# Patient Record
Sex: Male | Born: 1945 | Race: White | Hispanic: No | Marital: Married | Smoking: Former smoker
Health system: Southern US, Community
[De-identification: ages and names within clinical notes are randomized; demographics above are authoritative.]

## PROBLEM LIST (undated history)

## (undated) DIAGNOSIS — M109 Gout, unspecified: Secondary | ICD-10-CM

## (undated) DIAGNOSIS — E559 Vitamin D deficiency, unspecified: Secondary | ICD-10-CM

## (undated) DIAGNOSIS — E785 Hyperlipidemia, unspecified: Secondary | ICD-10-CM

## (undated) DIAGNOSIS — K219 Gastro-esophageal reflux disease without esophagitis: Secondary | ICD-10-CM

## (undated) DIAGNOSIS — G839 Paralytic syndrome, unspecified: Secondary | ICD-10-CM

## (undated) DIAGNOSIS — R748 Abnormal levels of other serum enzymes: Secondary | ICD-10-CM

## (undated) DIAGNOSIS — J329 Chronic sinusitis, unspecified: Secondary | ICD-10-CM

## (undated) DIAGNOSIS — I10 Essential (primary) hypertension: Secondary | ICD-10-CM

## (undated) DIAGNOSIS — D72829 Elevated white blood cell count, unspecified: Secondary | ICD-10-CM

## (undated) DIAGNOSIS — Z832 Family history of diseases of the blood and blood-forming organs and certain disorders involving the immune mechanism: Secondary | ICD-10-CM

## (undated) DIAGNOSIS — K5790 Diverticulosis of intestine, part unspecified, without perforation or abscess without bleeding: Secondary | ICD-10-CM

## (undated) DIAGNOSIS — I7789 Other specified disorders of arteries and arterioles: Secondary | ICD-10-CM

## (undated) DIAGNOSIS — E039 Hypothyroidism, unspecified: Secondary | ICD-10-CM

## (undated) DIAGNOSIS — I77819 Aortic ectasia, unspecified site: Secondary | ICD-10-CM

## (undated) HISTORY — DX: Gastro-esophageal reflux disease without esophagitis: K21.9

## (undated) HISTORY — DX: Hyperlipidemia, unspecified: E78.5

## (undated) HISTORY — DX: Elevated white blood cell count, unspecified: D72.829

## (undated) HISTORY — DX: Paralytic syndrome, unspecified: G83.9

## (undated) HISTORY — DX: Gout, unspecified: M10.9

## (undated) HISTORY — DX: Essential (primary) hypertension: I10

## (undated) HISTORY — DX: Chronic sinusitis, unspecified: J32.9

## (undated) HISTORY — DX: Hypothyroidism, unspecified: E03.9

## (undated) HISTORY — DX: Family history of diseases of the blood and blood-forming organs and certain disorders involving the immune mechanism: Z83.2

## (undated) HISTORY — DX: Vitamin D deficiency, unspecified: E55.9

## (undated) HISTORY — DX: Other specified disorders of arteries and arterioles: I77.89

## (undated) HISTORY — DX: Abnormal levels of other serum enzymes: R74.8

## (undated) HISTORY — DX: Aortic ectasia, unspecified site: I77.819

## (undated) HISTORY — DX: Diverticulosis of intestine, part unspecified, without perforation or abscess without bleeding: K57.90

---

## 2001-05-01 ENCOUNTER — Ambulatory Visit (HOSPITAL_COMMUNITY): Admission: RE | Admit: 2001-05-01 | Discharge: 2001-05-01 | Payer: Self-pay | Admitting: Gastroenterology

## 2004-01-28 ENCOUNTER — Encounter: Admission: RE | Admit: 2004-01-28 | Discharge: 2004-01-28 | Payer: Self-pay | Admitting: Internal Medicine

## 2004-02-09 ENCOUNTER — Encounter: Admission: RE | Admit: 2004-02-09 | Discharge: 2004-02-09 | Payer: Self-pay | Admitting: Gastroenterology

## 2005-10-19 ENCOUNTER — Encounter: Admission: RE | Admit: 2005-10-19 | Discharge: 2005-10-19 | Payer: Self-pay | Admitting: Internal Medicine

## 2013-11-20 DIAGNOSIS — E782 Mixed hyperlipidemia: Secondary | ICD-10-CM | POA: Diagnosis not present

## 2013-11-20 DIAGNOSIS — Z23 Encounter for immunization: Secondary | ICD-10-CM | POA: Diagnosis not present

## 2013-11-20 DIAGNOSIS — I10 Essential (primary) hypertension: Secondary | ICD-10-CM | POA: Diagnosis not present

## 2013-11-20 DIAGNOSIS — Z0000 Encounter for general adult medical examination without abnormal findings: Secondary | ICD-10-CM | POA: Diagnosis not present

## 2013-11-20 DIAGNOSIS — E039 Hypothyroidism, unspecified: Secondary | ICD-10-CM | POA: Diagnosis not present

## 2013-11-20 DIAGNOSIS — Z79899 Other long term (current) drug therapy: Secondary | ICD-10-CM | POA: Diagnosis not present

## 2013-11-20 DIAGNOSIS — E559 Vitamin D deficiency, unspecified: Secondary | ICD-10-CM | POA: Diagnosis not present

## 2013-11-20 DIAGNOSIS — M109 Gout, unspecified: Secondary | ICD-10-CM | POA: Diagnosis not present

## 2013-11-20 DIAGNOSIS — Z125 Encounter for screening for malignant neoplasm of prostate: Secondary | ICD-10-CM | POA: Diagnosis not present

## 2014-04-01 DIAGNOSIS — W57XXXA Bitten or stung by nonvenomous insect and other nonvenomous arthropods, initial encounter: Secondary | ICD-10-CM | POA: Diagnosis not present

## 2014-04-01 DIAGNOSIS — S30860A Insect bite (nonvenomous) of lower back and pelvis, initial encounter: Secondary | ICD-10-CM | POA: Diagnosis not present

## 2014-09-04 DIAGNOSIS — E039 Hypothyroidism, unspecified: Secondary | ICD-10-CM | POA: Diagnosis not present

## 2014-09-04 DIAGNOSIS — M109 Gout, unspecified: Secondary | ICD-10-CM | POA: Diagnosis not present

## 2014-09-04 DIAGNOSIS — I10 Essential (primary) hypertension: Secondary | ICD-10-CM | POA: Diagnosis not present

## 2014-09-04 DIAGNOSIS — E782 Mixed hyperlipidemia: Secondary | ICD-10-CM | POA: Diagnosis not present

## 2014-11-10 DIAGNOSIS — Z0000 Encounter for general adult medical examination without abnormal findings: Secondary | ICD-10-CM | POA: Diagnosis not present

## 2014-11-10 DIAGNOSIS — E782 Mixed hyperlipidemia: Secondary | ICD-10-CM | POA: Diagnosis not present

## 2014-11-10 DIAGNOSIS — K5730 Diverticulosis of large intestine without perforation or abscess without bleeding: Secondary | ICD-10-CM | POA: Diagnosis not present

## 2014-11-10 DIAGNOSIS — Z79899 Other long term (current) drug therapy: Secondary | ICD-10-CM | POA: Diagnosis not present

## 2014-11-10 DIAGNOSIS — E559 Vitamin D deficiency, unspecified: Secondary | ICD-10-CM | POA: Diagnosis not present

## 2014-11-10 DIAGNOSIS — I10 Essential (primary) hypertension: Secondary | ICD-10-CM | POA: Diagnosis not present

## 2014-11-10 DIAGNOSIS — Z125 Encounter for screening for malignant neoplasm of prostate: Secondary | ICD-10-CM | POA: Diagnosis not present

## 2014-11-10 DIAGNOSIS — M109 Gout, unspecified: Secondary | ICD-10-CM | POA: Diagnosis not present

## 2014-11-10 DIAGNOSIS — E039 Hypothyroidism, unspecified: Secondary | ICD-10-CM | POA: Diagnosis not present

## 2014-11-20 ENCOUNTER — Encounter (INDEPENDENT_AMBULATORY_CARE_PROVIDER_SITE_OTHER): Payer: Self-pay

## 2014-11-20 ENCOUNTER — Other Ambulatory Visit: Payer: Self-pay | Admitting: Internal Medicine

## 2014-11-20 ENCOUNTER — Ambulatory Visit
Admission: RE | Admit: 2014-11-20 | Discharge: 2014-11-20 | Disposition: A | Discharge status: Home / Self Care | Payer: Medicare Other | Source: Ambulatory Visit | Attending: Internal Medicine | Admitting: Internal Medicine

## 2014-11-20 DIAGNOSIS — R51 Headache: Secondary | ICD-10-CM | POA: Diagnosis not present

## 2014-11-20 DIAGNOSIS — I10 Essential (primary) hypertension: Secondary | ICD-10-CM | POA: Diagnosis not present

## 2014-11-20 DIAGNOSIS — R519 Headache, unspecified: Secondary | ICD-10-CM

## 2014-11-20 DIAGNOSIS — J0100 Acute maxillary sinusitis, unspecified: Secondary | ICD-10-CM | POA: Diagnosis not present

## 2014-12-11 ENCOUNTER — Other Ambulatory Visit: Payer: Self-pay | Admitting: Internal Medicine

## 2014-12-11 DIAGNOSIS — R51 Headache: Principal | ICD-10-CM

## 2014-12-11 DIAGNOSIS — R519 Headache, unspecified: Secondary | ICD-10-CM

## 2014-12-12 ENCOUNTER — Ambulatory Visit
Admission: RE | Admit: 2014-12-12 | Discharge: 2014-12-12 | Disposition: A | Discharge status: Home / Self Care | Payer: Medicare Other | Source: Ambulatory Visit | Attending: Internal Medicine | Admitting: Internal Medicine

## 2014-12-12 DIAGNOSIS — R519 Headache, unspecified: Secondary | ICD-10-CM

## 2014-12-12 DIAGNOSIS — R51 Headache: Principal | ICD-10-CM

## 2015-08-07 DIAGNOSIS — L738 Other specified follicular disorders: Secondary | ICD-10-CM | POA: Diagnosis not present

## 2015-08-07 DIAGNOSIS — I788 Other diseases of capillaries: Secondary | ICD-10-CM | POA: Diagnosis not present

## 2015-08-07 DIAGNOSIS — L570 Actinic keratosis: Secondary | ICD-10-CM | POA: Diagnosis not present

## 2015-08-07 DIAGNOSIS — L820 Inflamed seborrheic keratosis: Secondary | ICD-10-CM | POA: Diagnosis not present

## 2015-08-07 DIAGNOSIS — L821 Other seborrheic keratosis: Secondary | ICD-10-CM | POA: Diagnosis not present

## 2015-09-26 IMAGING — CT CT HEAD W/O CM
2 series · 16 of 30 positions shown, 20 images · non-contrast
Comparison: None.

CLINICAL DATA: Headache x8 weeks, right temporal pain with
throbbing sensation

EXAM:
CT HEAD WITHOUT CONTRAST
TECHNIQUE: Contiguous axial images were obtained from the base of the skull
through the vertex without intravenous contrast.

[Series 2: head w/o · axial · non-contrast · 0.49mm/px · z∈[-1,+136]mm · 13 of 32 slices shown, 17 images]
[im 3/32  brain]
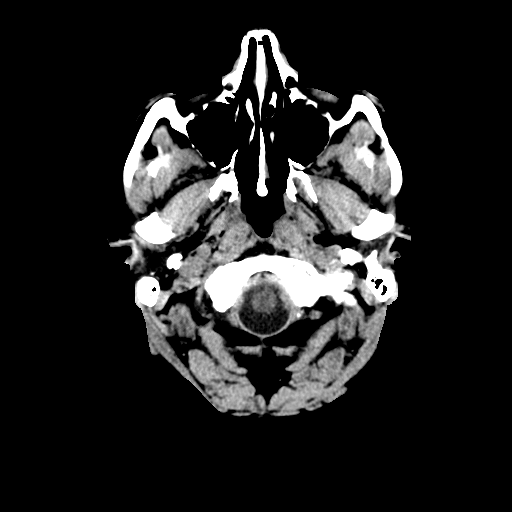
[im 3/32  bone]
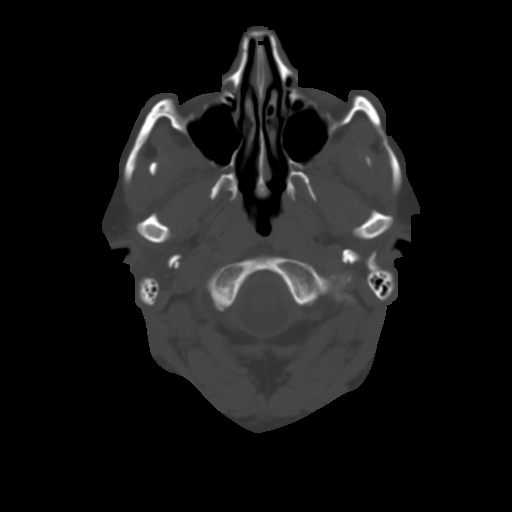
[im 5/32  brain]
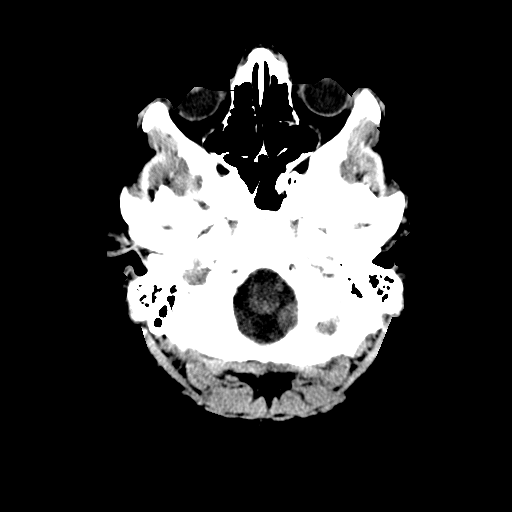
[im 7/32  brain]
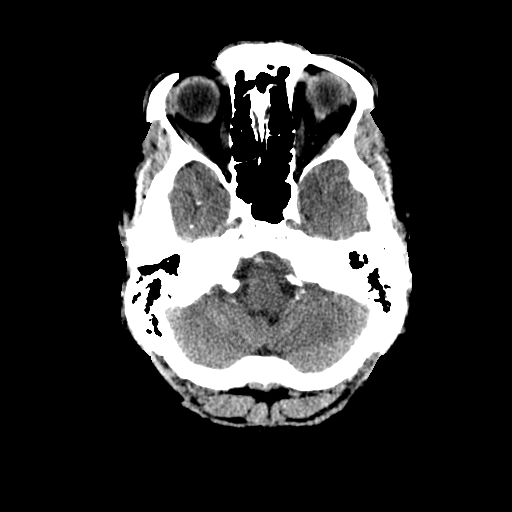
[im 9/32  brain]
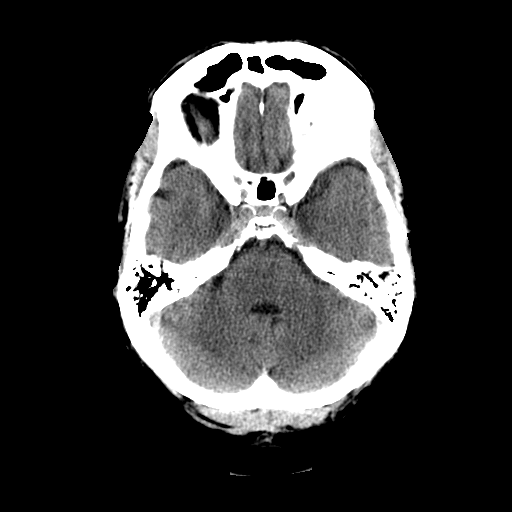
[im 12/32  brain]
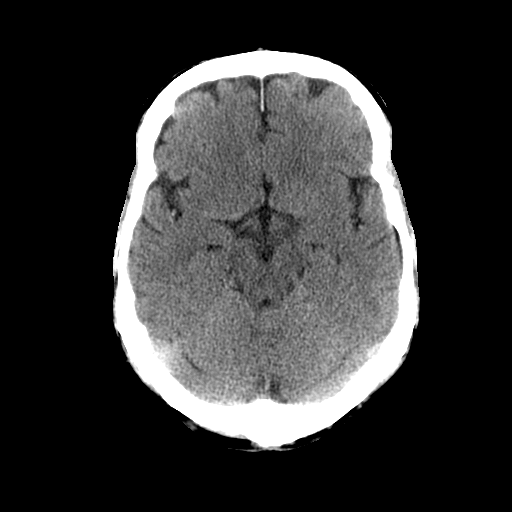
[im 12/32  bone]
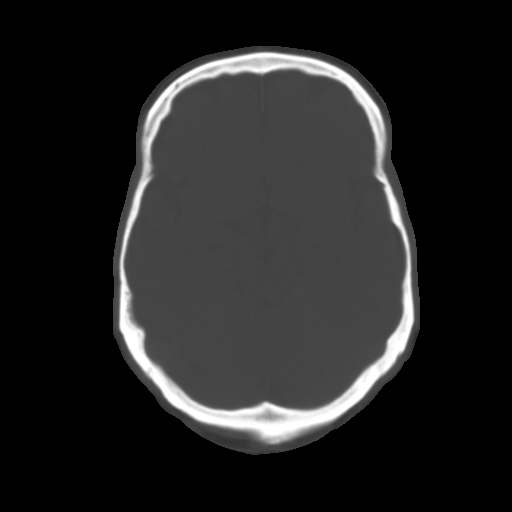
[im 14/32  brain]
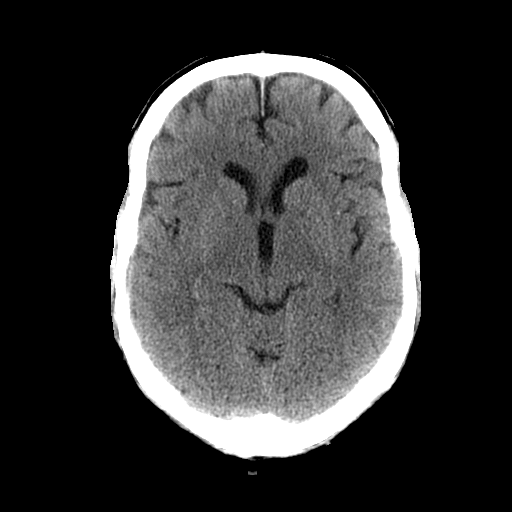
[im 16/32  brain]
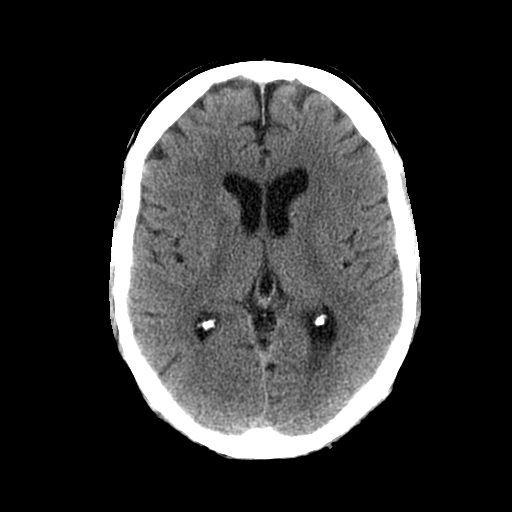
[im 18/32  brain]
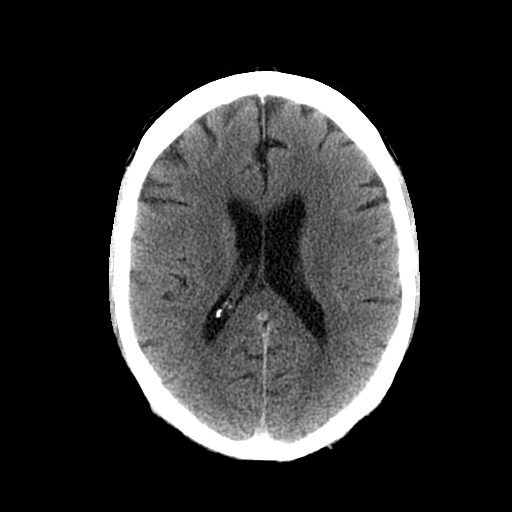
[im 20/32  brain]
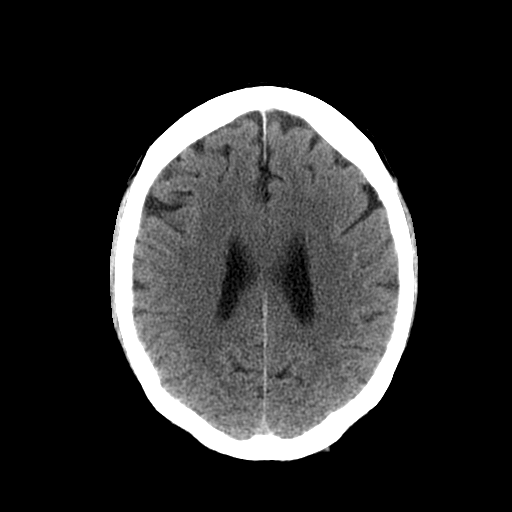
[im 20/32  bone]
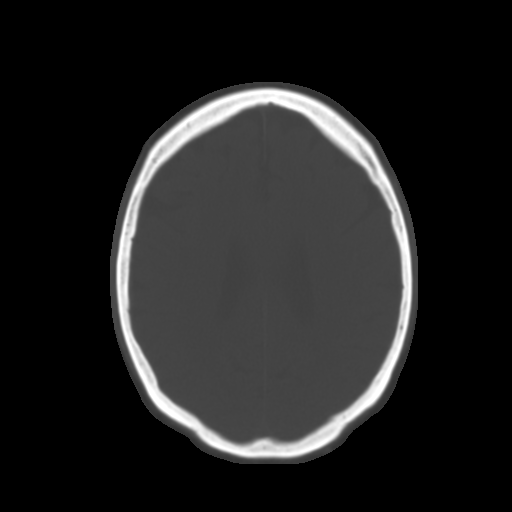
[im 23/32  brain]
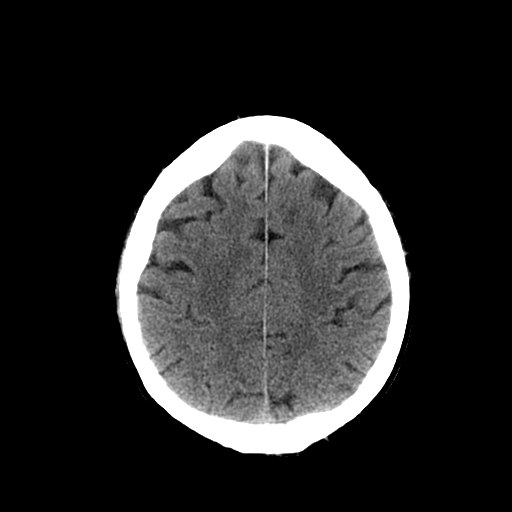
[im 25/32  brain]
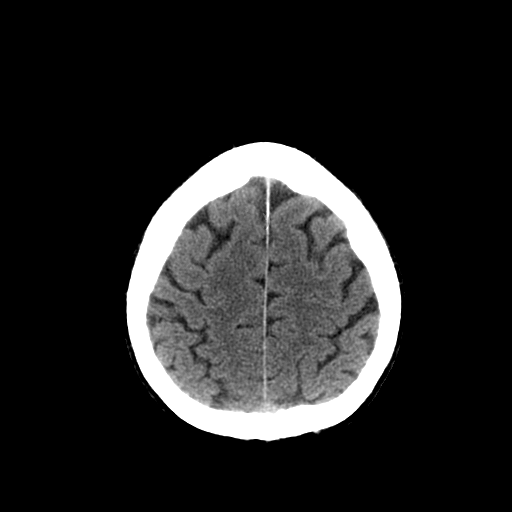
[im 27/32  brain]
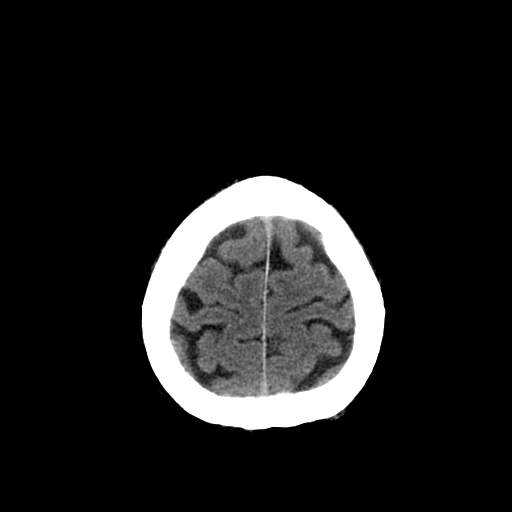
[im 29/32  brain]
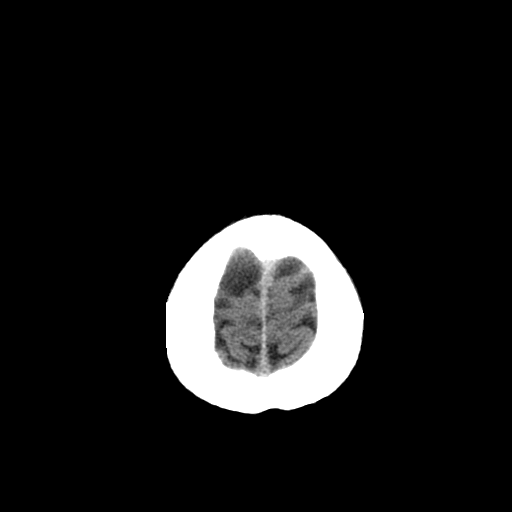
[im 29/32  bone]
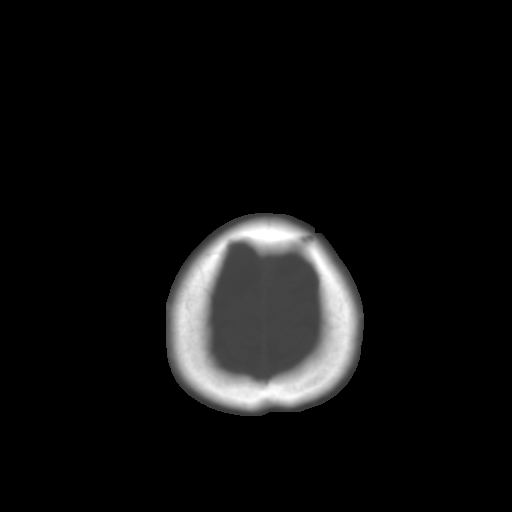

[Series 3: head bone · axial · 0.49mm/px · z∈[-1,+47]mm · 3 of 32 slices shown]
[im 3/32  bone]
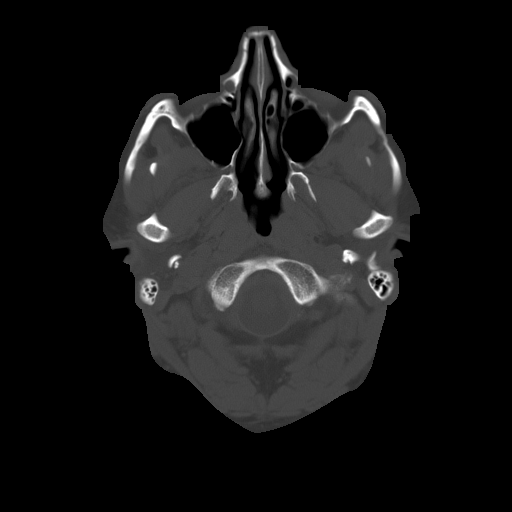
[im 7/32  bone]
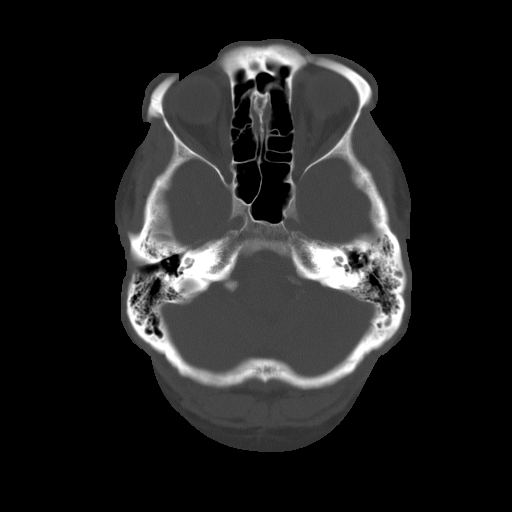
[im 12/32  bone]
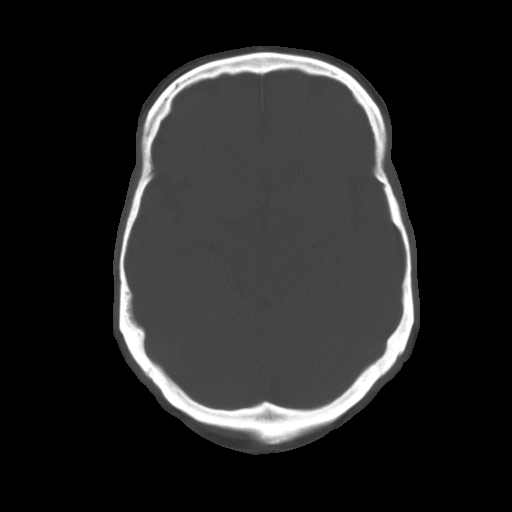

[16 of 30 positions shown; findings below may reference images not displayed]

FINDINGS: No evidence of parenchymal hemorrhage or extra-axial fluid
collection. No mass lesion, mass effect, or midline shift.

No CT evidence of acute infarction.

Cerebral volume is within normal limits.  No ventriculomegaly.

The visualized paranasal sinuses are essentially clear. The mastoid
air cells are unopacified.

No evidence of calvarial fracture.
IMPRESSION: Normal head CT.

## 2015-12-07 DIAGNOSIS — K5730 Diverticulosis of large intestine without perforation or abscess without bleeding: Secondary | ICD-10-CM | POA: Diagnosis not present

## 2015-12-07 DIAGNOSIS — Z125 Encounter for screening for malignant neoplasm of prostate: Secondary | ICD-10-CM | POA: Diagnosis not present

## 2015-12-07 DIAGNOSIS — E039 Hypothyroidism, unspecified: Secondary | ICD-10-CM | POA: Diagnosis not present

## 2015-12-07 DIAGNOSIS — Z1389 Encounter for screening for other disorder: Secondary | ICD-10-CM | POA: Diagnosis not present

## 2015-12-07 DIAGNOSIS — Z23 Encounter for immunization: Secondary | ICD-10-CM | POA: Diagnosis not present

## 2015-12-07 DIAGNOSIS — Z0001 Encounter for general adult medical examination with abnormal findings: Secondary | ICD-10-CM | POA: Diagnosis not present

## 2015-12-07 DIAGNOSIS — I10 Essential (primary) hypertension: Secondary | ICD-10-CM | POA: Diagnosis not present

## 2015-12-07 DIAGNOSIS — Z79899 Other long term (current) drug therapy: Secondary | ICD-10-CM | POA: Diagnosis not present

## 2015-12-07 DIAGNOSIS — E782 Mixed hyperlipidemia: Secondary | ICD-10-CM | POA: Diagnosis not present

## 2015-12-07 DIAGNOSIS — M109 Gout, unspecified: Secondary | ICD-10-CM | POA: Diagnosis not present

## 2015-12-07 DIAGNOSIS — E559 Vitamin D deficiency, unspecified: Secondary | ICD-10-CM | POA: Diagnosis not present

## 2016-09-23 DIAGNOSIS — H25093 Other age-related incipient cataract, bilateral: Secondary | ICD-10-CM | POA: Diagnosis not present

## 2016-12-19 DIAGNOSIS — Z1389 Encounter for screening for other disorder: Secondary | ICD-10-CM | POA: Diagnosis not present

## 2016-12-19 DIAGNOSIS — Z125 Encounter for screening for malignant neoplasm of prostate: Secondary | ICD-10-CM | POA: Diagnosis not present

## 2016-12-19 DIAGNOSIS — E782 Mixed hyperlipidemia: Secondary | ICD-10-CM | POA: Diagnosis not present

## 2016-12-19 DIAGNOSIS — K5730 Diverticulosis of large intestine without perforation or abscess without bleeding: Secondary | ICD-10-CM | POA: Diagnosis not present

## 2016-12-19 DIAGNOSIS — I10 Essential (primary) hypertension: Secondary | ICD-10-CM | POA: Diagnosis not present

## 2016-12-19 DIAGNOSIS — E559 Vitamin D deficiency, unspecified: Secondary | ICD-10-CM | POA: Diagnosis not present

## 2016-12-19 DIAGNOSIS — E039 Hypothyroidism, unspecified: Secondary | ICD-10-CM | POA: Diagnosis not present

## 2016-12-19 DIAGNOSIS — Z79899 Other long term (current) drug therapy: Secondary | ICD-10-CM | POA: Diagnosis not present

## 2016-12-19 DIAGNOSIS — Z0000 Encounter for general adult medical examination without abnormal findings: Secondary | ICD-10-CM | POA: Diagnosis not present

## 2016-12-19 DIAGNOSIS — M109 Gout, unspecified: Secondary | ICD-10-CM | POA: Diagnosis not present

## 2017-01-10 DIAGNOSIS — Z1211 Encounter for screening for malignant neoplasm of colon: Secondary | ICD-10-CM | POA: Diagnosis not present

## 2017-01-10 DIAGNOSIS — Z1212 Encounter for screening for malignant neoplasm of rectum: Secondary | ICD-10-CM | POA: Diagnosis not present

## 2017-01-26 DIAGNOSIS — L918 Other hypertrophic disorders of the skin: Secondary | ICD-10-CM | POA: Diagnosis not present

## 2017-01-26 DIAGNOSIS — L570 Actinic keratosis: Secondary | ICD-10-CM | POA: Diagnosis not present

## 2017-08-02 DIAGNOSIS — Z23 Encounter for immunization: Secondary | ICD-10-CM | POA: Diagnosis not present

## 2018-01-18 DIAGNOSIS — M109 Gout, unspecified: Secondary | ICD-10-CM | POA: Diagnosis not present

## 2018-01-18 DIAGNOSIS — Z1389 Encounter for screening for other disorder: Secondary | ICD-10-CM | POA: Diagnosis not present

## 2018-01-18 DIAGNOSIS — I10 Essential (primary) hypertension: Secondary | ICD-10-CM | POA: Diagnosis not present

## 2018-01-18 DIAGNOSIS — E559 Vitamin D deficiency, unspecified: Secondary | ICD-10-CM | POA: Diagnosis not present

## 2018-01-18 DIAGNOSIS — Z0000 Encounter for general adult medical examination without abnormal findings: Secondary | ICD-10-CM | POA: Diagnosis not present

## 2018-01-18 DIAGNOSIS — K5730 Diverticulosis of large intestine without perforation or abscess without bleeding: Secondary | ICD-10-CM | POA: Diagnosis not present

## 2018-01-18 DIAGNOSIS — Z832 Family history of diseases of the blood and blood-forming organs and certain disorders involving the immune mechanism: Secondary | ICD-10-CM | POA: Diagnosis not present

## 2018-01-18 DIAGNOSIS — E039 Hypothyroidism, unspecified: Secondary | ICD-10-CM | POA: Diagnosis not present

## 2018-01-18 DIAGNOSIS — G8320 Monoplegia of upper limb affecting unspecified side: Secondary | ICD-10-CM | POA: Diagnosis not present

## 2018-01-18 DIAGNOSIS — E782 Mixed hyperlipidemia: Secondary | ICD-10-CM | POA: Diagnosis not present

## 2018-01-18 DIAGNOSIS — Z125 Encounter for screening for malignant neoplasm of prostate: Secondary | ICD-10-CM | POA: Diagnosis not present

## 2018-01-18 DIAGNOSIS — Z79899 Other long term (current) drug therapy: Secondary | ICD-10-CM | POA: Diagnosis not present

## 2018-01-26 DIAGNOSIS — G8320 Monoplegia of upper limb affecting unspecified side: Secondary | ICD-10-CM | POA: Diagnosis not present

## 2018-01-26 DIAGNOSIS — Z832 Family history of diseases of the blood and blood-forming organs and certain disorders involving the immune mechanism: Secondary | ICD-10-CM | POA: Diagnosis not present

## 2018-08-16 DIAGNOSIS — Z23 Encounter for immunization: Secondary | ICD-10-CM | POA: Diagnosis not present

## 2019-05-21 DIAGNOSIS — Z1389 Encounter for screening for other disorder: Secondary | ICD-10-CM | POA: Diagnosis not present

## 2019-05-21 DIAGNOSIS — Z0000 Encounter for general adult medical examination without abnormal findings: Secondary | ICD-10-CM | POA: Diagnosis not present

## 2019-05-21 DIAGNOSIS — E782 Mixed hyperlipidemia: Secondary | ICD-10-CM | POA: Diagnosis not present

## 2019-05-21 DIAGNOSIS — Z79899 Other long term (current) drug therapy: Secondary | ICD-10-CM | POA: Diagnosis not present

## 2019-05-21 DIAGNOSIS — I10 Essential (primary) hypertension: Secondary | ICD-10-CM | POA: Diagnosis not present

## 2019-05-21 DIAGNOSIS — M109 Gout, unspecified: Secondary | ICD-10-CM | POA: Diagnosis not present

## 2019-05-21 DIAGNOSIS — K5730 Diverticulosis of large intestine without perforation or abscess without bleeding: Secondary | ICD-10-CM | POA: Diagnosis not present

## 2019-05-21 DIAGNOSIS — Z832 Family history of diseases of the blood and blood-forming organs and certain disorders involving the immune mechanism: Secondary | ICD-10-CM | POA: Diagnosis not present

## 2019-05-21 DIAGNOSIS — E039 Hypothyroidism, unspecified: Secondary | ICD-10-CM | POA: Diagnosis not present

## 2019-05-21 DIAGNOSIS — E559 Vitamin D deficiency, unspecified: Secondary | ICD-10-CM | POA: Diagnosis not present

## 2019-05-21 DIAGNOSIS — Z125 Encounter for screening for malignant neoplasm of prostate: Secondary | ICD-10-CM | POA: Diagnosis not present

## 2019-05-27 DIAGNOSIS — R7989 Other specified abnormal findings of blood chemistry: Secondary | ICD-10-CM | POA: Diagnosis not present

## 2019-05-27 DIAGNOSIS — R748 Abnormal levels of other serum enzymes: Secondary | ICD-10-CM | POA: Diagnosis not present

## 2019-06-11 DIAGNOSIS — Z20828 Contact with and (suspected) exposure to other viral communicable diseases: Secondary | ICD-10-CM | POA: Diagnosis not present

## 2019-07-31 DIAGNOSIS — Z23 Encounter for immunization: Secondary | ICD-10-CM | POA: Diagnosis not present

## 2019-08-27 ENCOUNTER — Other Ambulatory Visit: Payer: Self-pay

## 2019-08-27 DIAGNOSIS — Z20822 Contact with and (suspected) exposure to covid-19: Secondary | ICD-10-CM

## 2019-08-28 DIAGNOSIS — Z20828 Contact with and (suspected) exposure to other viral communicable diseases: Secondary | ICD-10-CM | POA: Diagnosis not present

## 2019-08-29 LAB — NOVEL CORONAVIRUS, NAA: SARS-CoV-2, NAA: NOT DETECTED

## 2019-08-31 DIAGNOSIS — Z20828 Contact with and (suspected) exposure to other viral communicable diseases: Secondary | ICD-10-CM | POA: Diagnosis not present

## 2019-09-01 DIAGNOSIS — Z20828 Contact with and (suspected) exposure to other viral communicable diseases: Secondary | ICD-10-CM | POA: Diagnosis not present

## 2019-09-19 DIAGNOSIS — M25561 Pain in right knee: Secondary | ICD-10-CM | POA: Diagnosis not present

## 2019-09-19 DIAGNOSIS — M25461 Effusion, right knee: Secondary | ICD-10-CM | POA: Diagnosis not present

## 2019-10-14 ENCOUNTER — Ambulatory Visit: Payer: Medicare Other | Attending: Internal Medicine

## 2019-10-14 DIAGNOSIS — Z20822 Contact with and (suspected) exposure to covid-19: Secondary | ICD-10-CM

## 2019-10-16 LAB — NOVEL CORONAVIRUS, NAA: SARS-CoV-2, NAA: NOT DETECTED

## 2019-11-15 ENCOUNTER — Ambulatory Visit: Payer: No Typology Code available for payment source

## 2019-11-20 ENCOUNTER — Ambulatory Visit: Payer: No Typology Code available for payment source

## 2019-12-23 ENCOUNTER — Other Ambulatory Visit: Payer: No Typology Code available for payment source

## 2019-12-25 DIAGNOSIS — L0232 Furuncle of buttock: Secondary | ICD-10-CM | POA: Diagnosis not present

## 2019-12-25 DIAGNOSIS — M25469 Effusion, unspecified knee: Secondary | ICD-10-CM | POA: Diagnosis not present

## 2020-06-03 DIAGNOSIS — Z1211 Encounter for screening for malignant neoplasm of colon: Secondary | ICD-10-CM | POA: Diagnosis not present

## 2020-06-03 DIAGNOSIS — R030 Elevated blood-pressure reading, without diagnosis of hypertension: Secondary | ICD-10-CM | POA: Diagnosis not present

## 2020-06-03 DIAGNOSIS — D72829 Elevated white blood cell count, unspecified: Secondary | ICD-10-CM | POA: Diagnosis not present

## 2020-06-03 DIAGNOSIS — E559 Vitamin D deficiency, unspecified: Secondary | ICD-10-CM | POA: Diagnosis not present

## 2020-06-03 DIAGNOSIS — Z0001 Encounter for general adult medical examination with abnormal findings: Secondary | ICD-10-CM | POA: Diagnosis not present

## 2020-06-03 DIAGNOSIS — E782 Mixed hyperlipidemia: Secondary | ICD-10-CM | POA: Diagnosis not present

## 2020-06-03 DIAGNOSIS — Z79899 Other long term (current) drug therapy: Secondary | ICD-10-CM | POA: Diagnosis not present

## 2020-06-03 DIAGNOSIS — R7989 Other specified abnormal findings of blood chemistry: Secondary | ICD-10-CM | POA: Diagnosis not present

## 2020-06-03 DIAGNOSIS — E039 Hypothyroidism, unspecified: Secondary | ICD-10-CM | POA: Diagnosis not present

## 2020-06-03 DIAGNOSIS — M109 Gout, unspecified: Secondary | ICD-10-CM | POA: Diagnosis not present

## 2020-06-04 DIAGNOSIS — Z1159 Encounter for screening for other viral diseases: Secondary | ICD-10-CM | POA: Diagnosis not present

## 2020-06-05 DIAGNOSIS — E782 Mixed hyperlipidemia: Secondary | ICD-10-CM | POA: Diagnosis not present

## 2020-06-05 DIAGNOSIS — I10 Essential (primary) hypertension: Secondary | ICD-10-CM | POA: Diagnosis not present

## 2020-06-05 DIAGNOSIS — E039 Hypothyroidism, unspecified: Secondary | ICD-10-CM | POA: Diagnosis not present

## 2020-06-25 DIAGNOSIS — Z20822 Contact with and (suspected) exposure to covid-19: Secondary | ICD-10-CM | POA: Diagnosis not present

## 2020-07-01 DIAGNOSIS — Z1212 Encounter for screening for malignant neoplasm of rectum: Secondary | ICD-10-CM | POA: Diagnosis not present

## 2020-07-01 DIAGNOSIS — Z1211 Encounter for screening for malignant neoplasm of colon: Secondary | ICD-10-CM | POA: Diagnosis not present

## 2020-07-16 DIAGNOSIS — Z23 Encounter for immunization: Secondary | ICD-10-CM | POA: Diagnosis not present

## 2020-08-12 DIAGNOSIS — Z23 Encounter for immunization: Secondary | ICD-10-CM | POA: Diagnosis not present

## 2020-08-25 DIAGNOSIS — K219 Gastro-esophageal reflux disease without esophagitis: Secondary | ICD-10-CM | POA: Diagnosis not present

## 2020-08-25 DIAGNOSIS — Z800 Family history of malignant neoplasm of digestive organs: Secondary | ICD-10-CM | POA: Diagnosis not present

## 2020-08-25 DIAGNOSIS — R195 Other fecal abnormalities: Secondary | ICD-10-CM | POA: Diagnosis not present

## 2020-09-14 DIAGNOSIS — Z1159 Encounter for screening for other viral diseases: Secondary | ICD-10-CM | POA: Diagnosis not present

## 2020-09-17 DIAGNOSIS — K648 Other hemorrhoids: Secondary | ICD-10-CM | POA: Diagnosis not present

## 2020-09-17 DIAGNOSIS — D122 Benign neoplasm of ascending colon: Secondary | ICD-10-CM | POA: Diagnosis not present

## 2020-09-17 DIAGNOSIS — D123 Benign neoplasm of transverse colon: Secondary | ICD-10-CM | POA: Diagnosis not present

## 2020-09-17 DIAGNOSIS — K5730 Diverticulosis of large intestine without perforation or abscess without bleeding: Secondary | ICD-10-CM | POA: Diagnosis not present

## 2020-09-17 DIAGNOSIS — R195 Other fecal abnormalities: Secondary | ICD-10-CM | POA: Diagnosis not present

## 2020-09-22 DIAGNOSIS — D122 Benign neoplasm of ascending colon: Secondary | ICD-10-CM | POA: Diagnosis not present

## 2020-09-22 DIAGNOSIS — D123 Benign neoplasm of transverse colon: Secondary | ICD-10-CM | POA: Diagnosis not present

## 2021-01-27 DIAGNOSIS — J320 Chronic maxillary sinusitis: Secondary | ICD-10-CM | POA: Diagnosis not present

## 2021-02-02 DIAGNOSIS — Z23 Encounter for immunization: Secondary | ICD-10-CM | POA: Diagnosis not present

## 2021-07-06 DIAGNOSIS — E559 Vitamin D deficiency, unspecified: Secondary | ICD-10-CM | POA: Diagnosis not present

## 2021-07-06 DIAGNOSIS — R7989 Other specified abnormal findings of blood chemistry: Secondary | ICD-10-CM | POA: Diagnosis not present

## 2021-07-06 DIAGNOSIS — E782 Mixed hyperlipidemia: Secondary | ICD-10-CM | POA: Diagnosis not present

## 2021-07-06 DIAGNOSIS — M109 Gout, unspecified: Secondary | ICD-10-CM | POA: Diagnosis not present

## 2021-07-06 DIAGNOSIS — I10 Essential (primary) hypertension: Secondary | ICD-10-CM | POA: Diagnosis not present

## 2021-07-06 DIAGNOSIS — Z79899 Other long term (current) drug therapy: Secondary | ICD-10-CM | POA: Diagnosis not present

## 2021-07-06 DIAGNOSIS — D72829 Elevated white blood cell count, unspecified: Secondary | ICD-10-CM | POA: Diagnosis not present

## 2021-07-06 DIAGNOSIS — E039 Hypothyroidism, unspecified: Secondary | ICD-10-CM | POA: Diagnosis not present

## 2021-07-06 DIAGNOSIS — R030 Elevated blood-pressure reading, without diagnosis of hypertension: Secondary | ICD-10-CM | POA: Diagnosis not present

## 2021-07-06 DIAGNOSIS — Z0001 Encounter for general adult medical examination with abnormal findings: Secondary | ICD-10-CM | POA: Diagnosis not present

## 2021-07-06 DIAGNOSIS — Z1211 Encounter for screening for malignant neoplasm of colon: Secondary | ICD-10-CM | POA: Diagnosis not present

## 2021-07-09 ENCOUNTER — Other Ambulatory Visit: Payer: Self-pay | Admitting: Internal Medicine

## 2021-07-09 DIAGNOSIS — Z0001 Encounter for general adult medical examination with abnormal findings: Secondary | ICD-10-CM

## 2021-07-27 ENCOUNTER — Ambulatory Visit
Admission: RE | Admit: 2021-07-27 | Discharge: 2021-07-27 | Disposition: A | Discharge status: Home / Self Care | Payer: No Typology Code available for payment source | Source: Ambulatory Visit | Attending: Internal Medicine | Admitting: Internal Medicine

## 2021-07-27 DIAGNOSIS — Z0001 Encounter for general adult medical examination with abnormal findings: Secondary | ICD-10-CM

## 2021-07-28 DIAGNOSIS — Z23 Encounter for immunization: Secondary | ICD-10-CM | POA: Diagnosis not present

## 2021-08-10 DIAGNOSIS — E782 Mixed hyperlipidemia: Secondary | ICD-10-CM | POA: Diagnosis not present

## 2021-08-10 DIAGNOSIS — R030 Elevated blood-pressure reading, without diagnosis of hypertension: Secondary | ICD-10-CM | POA: Diagnosis not present

## 2021-08-10 DIAGNOSIS — I10 Essential (primary) hypertension: Secondary | ICD-10-CM | POA: Diagnosis not present

## 2021-08-20 ENCOUNTER — Encounter: Payer: No Typology Code available for payment source | Admitting: Thoracic Surgery (Cardiothoracic Vascular Surgery)

## 2021-08-27 ENCOUNTER — Encounter: Payer: No Typology Code available for payment source | Admitting: Thoracic Surgery (Cardiothoracic Vascular Surgery)

## 2021-09-03 ENCOUNTER — Encounter: Payer: Medicare Other | Admitting: Thoracic Surgery (Cardiothoracic Vascular Surgery)

## 2021-09-30 ENCOUNTER — Ambulatory Visit (INDEPENDENT_AMBULATORY_CARE_PROVIDER_SITE_OTHER): Payer: Medicare Other | Admitting: Interventional Cardiology

## 2021-09-30 ENCOUNTER — Other Ambulatory Visit: Payer: Self-pay

## 2021-09-30 ENCOUNTER — Encounter: Payer: Self-pay | Admitting: Interventional Cardiology

## 2021-09-30 VITALS — BP 120/82 | HR 71 | Ht 70.0 in | Wt 225.0 lb

## 2021-09-30 DIAGNOSIS — I2584 Coronary atherosclerosis due to calcified coronary lesion: Secondary | ICD-10-CM | POA: Diagnosis not present

## 2021-09-30 DIAGNOSIS — I7121 Aneurysm of the ascending aorta, without rupture: Secondary | ICD-10-CM | POA: Diagnosis not present

## 2021-09-30 DIAGNOSIS — I10 Essential (primary) hypertension: Secondary | ICD-10-CM

## 2021-09-30 DIAGNOSIS — I2510 Atherosclerotic heart disease of native coronary artery without angina pectoris: Secondary | ICD-10-CM

## 2021-09-30 MED ORDER — METOPROLOL TARTRATE 25 MG PO TABS: 12.5000 mg | ORAL_TABLET | Freq: Two times a day (BID) | ORAL | 3 refills | Status: DC

## 2021-09-30 NOTE — Progress Notes (Signed)
Cardiology Office Note   Date:  09/30/2021   ID:  Glenn Young, Glenn Young 21-Apr-1946, MRN [id number]  PCP:  Josetta Huddle, MD    No chief complaint on file.  Aortic aneurysm  Wt Readings from Last 3 Encounters:  09/30/21 225 lb (102.1 kg)       History of Present Illness: Glenn Young is a 75 y.o. male who is being seen today for the evaluation of HTN at the request of Josetta Huddle, MD.   Diagnosed with thoracic aortic aneurysm incidentally on calcium score CT.  BP was high 6 months ago.  Exforge was restarted.    Denies : Chest pain. Dizziness. Leg edema. Nitroglycerin use. Orthopnea. Palpitations. Paroxysmal nocturnal dyspnea. Shortness of breath. Syncope.    He continues to be active.  He plays ice hockey.  No cardiac symptoms with that activity.  Calcium scoring CT showed: "CORONARY CALCIUM SCORES:   Left Main: 22   LAD: 4   LCx: 0   RCA: 16   Total Agatston Score: 42   MESA database percentile: 27   AORTA MEASUREMENTS:   Ascending Aorta: 49 mm   Descending Aorta: 31 mm"    Past Medical History:  Diagnosis Date   Ascending aorta enlargement (HCC)    Dilation of aorta (HCC)    Diverticulosis    Elevated creatine kinase level    Elevated white blood cell count    Family history of clotting disorder    GERD (gastroesophageal reflux disease)    Gout    Hyperlipidemia    Hypertension    Hypothyroidism    Paralysis (HCC)    Sinusitis    Vitamin D deficiency      Current Outpatient Medications  Medication Sig Dispense Refill   amLODipine-valsartan (EXFORGE) 5-160 MG tablet Take 1 tablet by mouth daily.     atorvastatin (LIPITOR) 20 MG tablet Take 20 mg by mouth 3 (three) times a week. TAKE WITH PROBIOTIC     Cholecalciferol (VITAMIN D3) 50 MCG (2000 UT) TABS Take by mouth.     Colchicine 0.6 MG CAPS Take by mouth.     indomethacin (INDOCIN) 50 MG capsule Take 50 mg by mouth 3 (three) times daily as needed for moderate pain (TAKE AS NEEDED  FOR INFLAMMATION.).     levothyroxine (SYNTHROID) 100 MCG tablet Take 100 mcg by mouth daily before breakfast.     naproxen (NAPROSYN) 250 MG tablet Take by mouth 2 (two) times daily with a meal.     Probiotic Product (PROBIOTIC DAILY PO) Take by mouth.     No current facility-administered medications for this visit.    Allergies:   Patient has no known allergies.    Social History:  The patient  reports that he has quit smoking. His smoking use included cigarettes. He has never been exposed to tobacco smoke. He has never used smokeless tobacco.   Family History: No family history of aneurysms.   ROS:  Please see the history of present illness.   Otherwise, review of systems are positive for abnormal CT.   All other systems are reviewed and negative.    PHYSICAL EXAM: VS:  BP 120/82    Pulse 71    Ht 5\' 10"  (1.778 m)    Wt 225 lb (102.1 kg)    SpO2 94%    BMI 32.28 kg/m  , BMI Body mass index is 32.28 kg/m. GEN: Well nourished, well developed, in no acute distress HEENT: normal Neck: no  JVD, carotid bruits, or masses Cardiac: RRR; no murmurs, rubs, or gallops,no edema  Respiratory:  clear to auscultation bilaterally, normal work of breathing GI: soft, nontender, nondistended, + BS MS: no deformity or atrophy Skin: warm and dry, no rash Neuro:  Strength and sensation are intact Psych: euthymic mood, full affect   EKG:   The ekg ordered today demonstrates normal sinus rhythm, no ST segment changes   Recent Labs: No results found for requested labs within last 8760 hours.   Lipid Panel No results found for: CHOL, TRIG, HDL, CHOLHDL, VLDL, LDLCALC, LDLDIRECT   Other studies Reviewed: Additional studies/ records that were reviewed today with results demonstrating: , scan reviewed.   ASSESSMENT AND PLAN:  Thoracic aortic aneurysm: We discussed the anatomy of the aneurysm.  We talked about blood pressure control to help prevent enlargement of the aneurysm.  Low-dose  beta-blocker will also help.  Add metoprolol 12.5 mg twice daily.  He needs to avoid straining.  We talked about surgical referral at some point.  Bicuspid aortic valve may change the threshold as to when his aneurysm needs repair.  We talked about an echocardiogram.  He is feeling well and would like to hold off on the echo for now.  Exam does not reveal any murmur.  I doubt he has a bicuspid aortic valve.  I also explained that prior to any surgery, he would need left and right heart catheterization. Coronary artery calcification: Mild.  Low calcium score. Hypertension: I stressed the importance of blood pressure management to prevent enlargement of the aneurysm. I do think it would be beneficial for him to see the surgeons to get more information.  He will try to get an appointment.  Referral was made previously. We also discussed the type of tearing chest pain to the back he might have if his aorta were to enlarge acutely, dissect, or rupture.   Current medicines are reviewed at length with the patient today.  The patient concerns regarding his medicines were addressed.  The following changes have been made:  No change  Labs/ tests ordered today include:  No orders of the defined types were placed in this encounter.   Recommend 150 minutes/week of aerobic exercise Low fat, low carb, high fiber diet recommended  Disposition:   FU in 6 months   Signed, Larae Grooms, MD  09/30/2021 3:59 PM    [facility] [address], [place], [place]  [address] Phone: [phone]; Fax: [phone]

## 2021-09-30 NOTE — Patient Instructions (Signed)
Medication Instructions:  Your physician has recommended you make the following change in your medication: Start metoprolol tartrate 12.5 mg by mouth twice daily  *If you need a refill on your cardiac medications before your next appointment, please call your pharmacy*   Lab Work: Your physician recommends that you return for lab work prior to CTA--BMP If you have labs (blood work) drawn today and your tests are completely normal, you will receive your results only by: [place] (if you have MyChart) OR A paper copy in the mail If you have any lab test that is abnormal or we need to change your treatment, we will call you to review the results.   Testing/Procedures: Non-Cardiac CT Angiography (CTA), is a special type of CT scan that uses a computer to produce multi-dimensional views of major blood vessels throughout the body. In CT angiography, a contrast material is injected through an IV to help visualize the blood vessels  To be done in 6 months   Follow-Up: At [facility], you and your health needs are our priority.  As part of our continuing mission to provide you with exceptional heart care, we have created designated Provider Care Teams.  These Care Teams include your primary Cardiologist (physician) and Advanced Practice Providers (APPs -  Physician Assistants and Nurse Practitioners) who all work together to provide you with the care you need, when you need it.  We recommend signing up for the patient portal called "MyChart".  Sign up information is provided on this After Visit Summary.  MyChart is used to connect with patients for Virtual Visits (Telemedicine).  Patients are able to view lab/test results, encounter notes, upcoming appointments, etc.  Non-urgent messages can be sent to your provider as well.   To learn more about what you can do with MyChart, go to [email or web address].    Your next appointment:   July 2023  The format for your next appointment:   In  Person  Provider:   Larae Grooms, MD     Other Instructions Avoid heavy lifting and straining  You have been referred to Dr Kipp Brood--

## 2021-10-01 ENCOUNTER — Telehealth: Payer: Self-pay | Admitting: *Deleted

## 2021-10-01 NOTE — Telephone Encounter (Signed)
-----   Message from Jettie Booze, MD sent at 10/01/2021  1:25 PM EST ----- OK.  I think the patient will be agreeable to these tests as I explained to him that he would need an echo, cath prior to surgery.    JV   ----- Message ----- From: Thompson Grayer, RN Sent: 10/01/2021  10:18 AM EST To: Jettie Booze, MD, #   ----- Message ----- From: Ned Clines Sent: 10/01/2021  10:16 AM EST To: Thompson Grayer, RN  TCTS received referral for this patient and Dr Kipp Brood would like for patient to have CTA Chest and Echo before we schedule the patient here at our office. As soon as these test are scheduled we will set up appointment for patient. Thank you  Cindy TCTS

## 2021-10-01 NOTE — Telephone Encounter (Signed)
Left message to call office

## 2021-10-06 ENCOUNTER — Encounter: Payer: Self-pay | Admitting: Interventional Cardiology

## 2021-10-06 DIAGNOSIS — I7121 Aneurysm of the ascending aorta, without rupture: Secondary | ICD-10-CM

## 2021-10-06 NOTE — Telephone Encounter (Signed)
Left message to call office

## 2021-10-07 NOTE — Telephone Encounter (Signed)
See my chart message

## 2021-10-13 ENCOUNTER — Other Ambulatory Visit: Payer: Self-pay

## 2021-10-13 ENCOUNTER — Other Ambulatory Visit: Payer: Medicare Other | Admitting: *Deleted

## 2021-10-13 DIAGNOSIS — I7121 Aneurysm of the ascending aorta, without rupture: Secondary | ICD-10-CM

## 2021-10-13 LAB — BASIC METABOLIC PANEL
BUN/Creatinine Ratio: 17 (ref 10–24)
BUN: 24 mg/dL (ref 8–27)
CO2: 23 mmol/L (ref 20–29)
Calcium: 9.3 mg/dL (ref 8.6–10.2)
Chloride: 101 mmol/L (ref 96–106)
Creatinine, Ser: 1.38 mg/dL — ABNORMAL HIGH (ref 0.76–1.27)
Glucose: 82 mg/dL (ref 70–99)
Potassium: 4.7 mmol/L (ref 3.5–5.2)
Sodium: 139 mmol/L (ref 134–144)
eGFR: 54 mL/min/{1.73_m2} — ABNORMAL LOW (ref >59)

## 2021-11-08 ENCOUNTER — Other Ambulatory Visit: Payer: Self-pay

## 2021-11-08 ENCOUNTER — Ambulatory Visit
Admission: RE | Admit: 2021-11-08 | Discharge: 2021-11-08 | Disposition: A | Discharge status: Home / Self Care | Payer: Medicare Other | Source: Ambulatory Visit | Attending: Interventional Cardiology | Admitting: Interventional Cardiology

## 2021-11-08 DIAGNOSIS — I7121 Aneurysm of the ascending aorta, without rupture: Secondary | ICD-10-CM

## 2021-11-08 MED ORDER — IOPAMIDOL (ISOVUE-370) INJECTION 76%
75.0000 mL | Freq: Once | INTRAVENOUS | Status: AC | PRN
  Administered 2021-11-08: 75 mL via INTRAVENOUS

## 2021-11-09 ENCOUNTER — Telehealth: Payer: Self-pay | Admitting: Interventional Cardiology

## 2021-11-09 ENCOUNTER — Ambulatory Visit (HOSPITAL_COMMUNITY): Payer: Medicare Other | Attending: Cardiology

## 2021-11-09 DIAGNOSIS — R9389 Abnormal findings on diagnostic imaging of other specified body structures: Secondary | ICD-10-CM

## 2021-11-09 DIAGNOSIS — I7121 Aneurysm of the ascending aorta, without rupture: Secondary | ICD-10-CM | POA: Insufficient documentation

## 2021-11-09 LAB — ECHOCARDIOGRAM COMPLETE
Area-P 1/2: 2.37 cm2
S' Lateral: 2.7 cm

## 2021-11-09 NOTE — Telephone Encounter (Signed)
Patient notified.  He is seeing Dr Kipp Brood on 11/12/21.  Patient has a gastroenterologist-- Dr Therisa Doyne at [facility].  Referral placed and patient will follow up on this.  CT results routed to Dr Therisa Doyne and Dr Inda Merlin.

## 2021-11-09 NOTE — Progress Notes (Signed)
[address]       [place] [address]             [phone]        Glenn Young Medical Record #[id number] Date of Birth: 1945-12-03  Referring: Josetta Huddle, MD Primary Care: Josetta Huddle, MD Primary Cardiologist:Jayadeep Irish Lack, MD  Chief Complaint:    Chief Complaint  Patient presents with   Thoracic Aortic Aneurysm    Surgical consult, Cardiac CT 07/27/21, CTA Chest 11/08/21    History of Present Illness:     76 year old male referred for surgical evaluation of an aortic root and ascending aortic aneurysm.  This was found incidentally on coronary CT calcium scoring.  The patient denies any chest or back pain.  He denies any shortness of breath.   Past Medical and Surgical History: Previous Chest Surgery: No Previous Chest Radiation: No Diabetes Mellitus: No.  HbA1C pending Creatinine: 3 8  Past Medical History:  Diagnosis Date   Ascending aorta enlargement (HCC)    Dilation of aorta (HCC)    Diverticulosis    Elevated creatine kinase level    Elevated white blood cell count    Family history of clotting disorder    GERD (gastroesophageal reflux disease)    Gout    Hyperlipidemia    Hypertension    Hypothyroidism    Paralysis (HCC)    Sinusitis    Vitamin D deficiency       Social History   Tobacco Use  Smoking Status Former   Types: Cigarettes   Passive exposure: Never  Smokeless Tobacco Never    Social History   Substance and Sexual Activity  Alcohol Use None     No Known Allergies    Current Outpatient Medications  Medication Sig Dispense Refill   amLODipine-valsartan (EXFORGE) 5-160 MG tablet Take 1 tablet by mouth daily.     atorvastatin (LIPITOR) 20 MG tablet Take 20 mg by mouth 3 (three) times a week. TAKE WITH PROBIOTIC     Cholecalciferol (VITAMIN D3) 50 MCG (2000 UT) TABS Take by mouth.     Colchicine 0.6 MG CAPS Take by mouth.     levothyroxine (SYNTHROID) 100 MCG tablet Take 100 mcg by  mouth daily before breakfast.     metoprolol tartrate (LOPRESSOR) 25 MG tablet Take 0.5 tablets (12.5 mg total) by mouth 2 (two) times daily. 90 tablet 3   naproxen (NAPROSYN) 250 MG tablet Take by mouth 2 (two) times daily with a meal.     Probiotic Product (PROBIOTIC DAILY PO) Take by mouth.     indomethacin (INDOCIN) 50 MG capsule Take 50 mg by mouth 3 (three) times daily as needed for moderate pain (TAKE AS NEEDED FOR INFLAMMATION.). (Patient not taking: Reported on 11/12/2021)     No current facility-administered medications for this visit.    (Not in a hospital admission)   No family history on file.   Review of Systems:   Review of Systems  Constitutional:  Negative for fever, malaise/fatigue and weight loss.  Respiratory:  Negative for shortness of breath.   Cardiovascular:  Negative for chest pain and palpitations.  Musculoskeletal:  Positive for joint pain. Negative for myalgias.  Neurological: Negative.      Physical Exam: BP 111/82    Pulse 84    Resp 20    Ht 5\' 10"  (1.778 m)    Wt 225 lb (102.1 kg)    SpO2 97% Comment: RA  BMI 32.28 kg/m  Physical Exam Constitutional:      General: He is not in acute distress.    Appearance: Normal appearance. He is normal weight. He is not ill-appearing.  HENT:     Head: Normocephalic and atraumatic.  Eyes:     Extraocular Movements: Extraocular movements intact.  Cardiovascular:     Rate and Rhythm: Normal rate and regular rhythm.     Heart sounds: No murmur heard. Pulmonary:     Effort: Pulmonary effort is normal. No respiratory distress.     Breath sounds: Normal breath sounds.  Abdominal:     General: Abdomen is flat. There is no distension.  Musculoskeletal:        General: Normal range of motion.     Cervical back: Normal range of motion.  Skin:    General: Skin is warm and dry.  Neurological:     General: No focal deficit present.     Mental Status: He is alert and oriented to person, place, and time.       Diagnostic Studies & Laboratory data:   CT chest: IMPRESSION: 1. Dilated aortic root measuring up to 4.4 cm and ascending thoracic aortic aneurysm measuring up to 4.9 cm, unchanged in size when compared to prior exam. 2. Soft tissue nodule of the upper abdomen which possibly arises from the stomach, differential considerations include exophytic gastrointestinal stromal tumor or enlarged lymph node. Recommend EUS/FNA for further evaluation.   Echo: IMPRESSIONS     1. Left ventricular ejection fraction, by estimation, is 60 to 65%. The  left ventricle has normal function. The left ventricle has no regional  wall motion abnormalities. There is mild asymmetric left ventricular  hypertrophy of the basal-septal segment.  Left ventricular diastolic parameters are consistent with Grade I  diastolic dysfunction (impaired relaxation).   2. Right ventricular systolic function is normal. The right ventricular  size is normal.   3. The mitral valve is degenerative. No evidence of mitral valve  regurgitation. No evidence of mitral stenosis.   4. The aortic valve is normal in structure. Aortic valve regurgitation is  not visualized. No aortic stenosis is present.   5. Aortic dilatation noted. There is moderate dilatation of the aortic  root, measuring 45 mm. There is severe dilatation of the ascending aorta,  measuring 50 mm.   6. The inferior vena cava is normal in size with greater than 50%  respiratory variability, suggesting right atrial pressure of 3 mmHg.   I have independently reviewed the above radiologic studies and discussed with the patient   Recent Lab Findings: Lab Results  Component Value Date   GLUCOSE 82 10/13/2021   NA 139 10/13/2021   K 4.7 10/13/2021   CL 101 10/13/2021   CREATININE 1.38 (H) 10/13/2021   BUN 24 10/13/2021   CO2 23 10/13/2021      Assessment / Plan:   76 year old male with ascending aortic aneurysm.  I personally reviewed the  cross-sectional imaging.  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  The maximal diameter of his ascending aorta is 4.9 cm.  The echocardiogram did not show any aortic valve insufficiency, and he has preserved biventricular function.  I will see him back in 6 months with another CT chest for surveillance.   He also has a soft tissue nodule in his upper abdomen arising from the stomach.  He is already established care with gastroenterology.    I  spent 40 minutes counseling  the patient face to face.   Lajuana Matte 11/12/2021 11:45 AM

## 2021-11-09 NOTE — Telephone Encounter (Signed)
Needs to see CVTS for aortic aneurysm.  Stable at 4.9 cm.  I suspect they will continue to watch this closely.  Needs to see GI for endoscopy given possible finding noted in the stomach

## 2021-11-09 NOTE — Telephone Encounter (Signed)
I spoke with Malachy Mood from [facility]. She is calling with the following report from CT Angio-  IMPRESSION: 1. Dilated aortic root measuring up to 4.4 cm and ascending thoracic aortic aneurysm measuring up to 4.9 cm, unchanged in size when compared to prior exam. 2. Soft tissue nodule of the upper abdomen which possibly arises from the stomach, differential considerations include exophytic gastrointestinal stromal tumor or enlarged lymph node. Recommend EUS/FNA for further evaluation.

## 2021-11-09 NOTE — Telephone Encounter (Signed)
Call with a reading on a CT scan.

## 2021-11-11 DIAGNOSIS — M25551 Pain in right hip: Secondary | ICD-10-CM | POA: Diagnosis not present

## 2021-11-12 ENCOUNTER — Institutional Professional Consult (permissible substitution) (INDEPENDENT_AMBULATORY_CARE_PROVIDER_SITE_OTHER): Payer: Medicare Other | Admitting: Thoracic Surgery (Cardiothoracic Vascular Surgery)

## 2021-11-12 ENCOUNTER — Other Ambulatory Visit: Payer: Self-pay

## 2021-11-12 VITALS — BP 111/82 | HR 84 | Resp 20 | Ht 70.0 in | Wt 225.0 lb

## 2021-11-12 DIAGNOSIS — I7121 Aneurysm of the ascending aorta, without rupture: Secondary | ICD-10-CM

## 2021-11-23 ENCOUNTER — Other Ambulatory Visit: Payer: Self-pay | Admitting: Gastroenterology

## 2021-12-21 ENCOUNTER — Encounter (HOSPITAL_COMMUNITY): Payer: Self-pay | Admitting: Gastroenterology

## 2021-12-21 NOTE — Progress Notes (Signed)
Attempted to obtain medical history via telephone, unable to reach at this time. I left a voicemail to return pre surgical testing department's phone call.  

## 2021-12-28 NOTE — Anesthesia Preprocedure Evaluation (Addendum)
Anesthesia Evaluation  ?Patient identified by MRN, date of birth, ID band ?Patient awake ? ? ? ?Reviewed: ?Allergy & Precautions, NPO status , Patient's Chart, lab work & pertinent test results ? ?History of Anesthesia Complications ?Negative for: history of anesthetic complications ? ?Airway ?Mallampati: II ? ?TM Distance: >3 FB ?Neck ROM: Full ? ? ? Dental ?no notable dental hx. ?(+) Dental Advisory Given ?  ?Pulmonary ?neg pulmonary ROS, former smoker,  ?  ?Pulmonary exam normal ? ? ? ? ? ? ? Cardiovascular ?hypertension, Pt. on medications and Pt. on home beta blockers ?Normal cardiovascular exam ? ?IMPRESSIONS  ? ? ??1. Left ventricular ejection fraction, by estimation, is 60 to 65%. The  ?left ventricle has normal function. The left ventricle has no regional  ?wall motion abnormalities. There is mild asymmetric left ventricular  ?hypertrophy of the basal-septal segment.  ?Left ventricular diastolic parameters are consistent with Grade I  ?diastolic dysfunction (impaired relaxation).  ??2. Right ventricular systolic function is normal. The right ventricular  ?size is normal.  ??3. The mitral valve is degenerative. No evidence of mitral valve  ?regurgitation. No evidence of mitral stenosis.  ??4. The aortic valve is normal in structure. Aortic valve regurgitation is  ?not visualized. No aortic stenosis is present.  ??5. Aortic dilatation noted. There is moderate dilatation of the aortic  ?root, measuring 45 mm. There is severe dilatation of the ascending aorta,  ?measuring 50 mm.  ??6. The inferior vena cava is normal in size with greater than 50%  ?respiratory variability, suggesting right atrial pressure of 3 mmHg.  ?  ?Neuro/Psych ?negative neurological ROS ?   ? GI/Hepatic ?Neg liver ROS, GERD  ,  ?Endo/Other  ?Hypothyroidism  ? Renal/GU ?negative Renal ROS  ? ?  ?Musculoskeletal ?negative musculoskeletal ROS ?(+)  ? Abdominal ?  ?Peds ? Hematology ?negative hematology  ROS ?(+)   ?Anesthesia Other Findings ? ? Reproductive/Obstetrics ? ?  ? ? ? ? ? ? ? ? ? ? ? ? ? ?  ?  ? ? ? ? ? ? ? ?Anesthesia Physical ?Anesthesia Plan ? ?ASA: 3 ? ?Anesthesia Plan: MAC  ? ?Post-op Pain Management:   ? ?Induction:  ? ?PONV Risk Score and Plan: Propofol infusion ? ?Airway Management Planned: Natural Airway ? ?Additional Equipment:  ? ?Intra-op Plan:  ? ?Post-operative Plan:  ? ?Informed Consent: I have reviewed the patients History and Physical, chart, labs and discussed the procedure including the risks, benefits and alternatives for the proposed anesthesia with the patient or authorized representative who has indicated his/her understanding and acceptance.  ? ? ? ?Dental advisory given ? ?Plan Discussed with: Anesthesiologist and CRNA ? ?Anesthesia Plan Comments:   ? ? ? ? ? ?Anesthesia Quick Evaluation ? ?

## 2021-12-29 ENCOUNTER — Ambulatory Visit (HOSPITAL_COMMUNITY)
Admission: RE | Admit: 2021-12-29 | Discharge: 2021-12-29 | Disposition: A | Discharge status: Home / Self Care | Payer: Medicare Other | Source: Ambulatory Visit | Attending: Gastroenterology | Admitting: Gastroenterology

## 2021-12-29 ENCOUNTER — Other Ambulatory Visit: Payer: Self-pay

## 2021-12-29 ENCOUNTER — Ambulatory Visit (HOSPITAL_BASED_OUTPATIENT_CLINIC_OR_DEPARTMENT_OTHER): Payer: Medicare Other | Admitting: Certified Registered Nurse Anesthetist

## 2021-12-29 ENCOUNTER — Ambulatory Visit (HOSPITAL_COMMUNITY): Payer: Medicare Other | Admitting: Certified Registered Nurse Anesthetist

## 2021-12-29 ENCOUNTER — Encounter (HOSPITAL_COMMUNITY): Payer: Self-pay | Admitting: Gastroenterology

## 2021-12-29 ENCOUNTER — Encounter (HOSPITAL_COMMUNITY)
Admission: RE | Disposition: A | Discharge status: Home / Self Care | Payer: Self-pay | Source: Ambulatory Visit | Attending: Gastroenterology

## 2021-12-29 DIAGNOSIS — I719 Aortic aneurysm of unspecified site, without rupture: Secondary | ICD-10-CM | POA: Diagnosis not present

## 2021-12-29 DIAGNOSIS — I10 Essential (primary) hypertension: Secondary | ICD-10-CM | POA: Insufficient documentation

## 2021-12-29 DIAGNOSIS — K3189 Other diseases of stomach and duodenum: Secondary | ICD-10-CM

## 2021-12-29 DIAGNOSIS — K2100 Gastro-esophageal reflux disease with esophagitis, without bleeding: Secondary | ICD-10-CM | POA: Diagnosis not present

## 2021-12-29 DIAGNOSIS — K209 Esophagitis, unspecified without bleeding: Secondary | ICD-10-CM | POA: Diagnosis not present

## 2021-12-29 DIAGNOSIS — E039 Hypothyroidism, unspecified: Secondary | ICD-10-CM | POA: Diagnosis not present

## 2021-12-29 DIAGNOSIS — R9389 Abnormal findings on diagnostic imaging of other specified body structures: Secondary | ICD-10-CM | POA: Insufficient documentation

## 2021-12-29 DIAGNOSIS — Z87891 Personal history of nicotine dependence: Secondary | ICD-10-CM | POA: Insufficient documentation

## 2021-12-29 DIAGNOSIS — R8569 Abnormal cytological findings in specimens from other digestive organs and abdominal cavity: Secondary | ICD-10-CM | POA: Diagnosis not present

## 2021-12-29 SURGERY — EGD (ESOPHAGOGASTRODUODENOSCOPY)
Anesthesia: Monitor Anesthesia Care

## 2021-12-29 MED ORDER — OMEPRAZOLE MAGNESIUM 20 MG PO TBEC: 20.0000 mg | DELAYED_RELEASE_TABLET | Freq: Two times a day (BID) | ORAL | Status: DC

## 2021-12-29 MED ORDER — PROPOFOL 10 MG/ML IV BOLUS
INTRAVENOUS | Status: DC | PRN
  Administered 2021-12-29 (×2): 20 mg via INTRAVENOUS

## 2021-12-29 MED ORDER — PROPOFOL 10 MG/ML IV BOLUS
INTRAVENOUS | Status: AC
  Filled 2021-12-29: qty 20

## 2021-12-29 MED ORDER — LIDOCAINE 2% (20 MG/ML) 5 ML SYRINGE
INTRAMUSCULAR | Status: DC | PRN
  Administered 2021-12-29: 100 mg via INTRAVENOUS

## 2021-12-29 MED ORDER — LACTATED RINGERS IV SOLN: INTRAVENOUS | Status: DC

## 2021-12-29 MED ORDER — PROPOFOL 500 MG/50ML IV EMUL
INTRAVENOUS | Status: DC | PRN
  Administered 2021-12-29: 125 ug/kg/min via INTRAVENOUS

## 2021-12-29 MED ORDER — SODIUM CHLORIDE 0.9 % IV SOLN: INTRAVENOUS | Status: DC

## 2021-12-29 NOTE — Anesthesia Procedure Notes (Signed)
Procedure Name: [place] ?Date/Time: 12/29/2021 9:34 AM ?Performed by: West Pugh, CRNA ?Pre-anesthesia Checklist: Patient identified, Emergency Drugs available, Suction available, Patient being monitored and Timeout performed ?Patient Re-evaluated:Patient Re-evaluated prior to induction ?Oxygen Delivery Method: Simple face mask ?Preoxygenation: Pre-oxygenation with 100% oxygen ?Induction Type: IV induction ?Placement Confirmation: positive ETCO2 ?Dental Injury: Teeth and Oropharynx as per pre-operative assessment  ? ? ? ? ?

## 2021-12-29 NOTE — H&P (Signed)
[facility] H/P Note ? ?Chief Complaint: abnormal CT ? ?HPI: Glenn Young is an 76 y.o. male.  Had CT for aortic aneurysm and incidentally found nodule vs LN in/along proximal stomach.  Here for evaluation via EUS.  No abdominal pain, blood in stool, change in bowel habits, unintentional weight loss. ? ?Past Medical History:  ?Diagnosis Date  ? Ascending aorta enlargement (HCC)   ? Dilation of aorta (HCC)   ? Diverticulosis   ? Elevated creatine kinase level   ? Elevated white blood cell count   ? Family history of clotting disorder   ? GERD (gastroesophageal reflux disease)   ? Gout   ? Hyperlipidemia   ? Hypertension   ? Hypothyroidism   ? Paralysis ([place])   ? Sinusitis   ? Vitamin D deficiency   ? ? ?History reviewed. No pertinent surgical history. ? ?Medications Prior to Admission  ?Medication Sig Dispense Refill  ? amLODipine-valsartan (EXFORGE) 5-160 MG tablet Take 1 tablet by mouth daily.    ? Cholecalciferol (VITAMIN D3) 50 MCG (2000 UT) TABS Take by mouth.    ? levothyroxine (SYNTHROID) 100 MCG tablet Take 100 mcg by mouth daily before breakfast.    ? metoprolol tartrate (LOPRESSOR) 25 MG tablet Take 0.5 tablets (12.5 mg total) by mouth 2 (two) times daily. 90 tablet 3  ? atorvastatin (LIPITOR) 20 MG tablet Take 20 mg by mouth 3 (three) times a week. TAKE WITH PROBIOTIC    ? Colchicine 0.6 MG CAPS Take by mouth.    ? indomethacin (INDOCIN) 50 MG capsule Take 50 mg by mouth 3 (three) times daily as needed for moderate pain (TAKE AS NEEDED FOR INFLAMMATION.). (Patient not taking: Reported on 11/12/2021)    ? naproxen (NAPROSYN) 250 MG tablet Take by mouth 2 (two) times daily with a meal.    ? Probiotic Product (PROBIOTIC DAILY PO) Take by mouth.    ? ? ?Allergies: No Known Allergies ? ?History reviewed. No pertinent family history. ? ?Social History:  reports that he has quit smoking. His smoking use included cigarettes. He has never been exposed to tobacco smoke. He has never used smokeless  tobacco. No history on file for alcohol use and drug use. ? ? ?ROS: As per HPI, all others negative ? ?Blood pressure (!) 146/103, pulse 69, temperature 98.1 ?F (36.7 ?C), temperature source Oral, resp. rate 20, height '5\' 11"'$  (1.803 m), weight 97.5 kg, SpO2 100 %. ?General appearance: NAD ?HEENT:  [place]/AT, anicteric ?NECK:  Supple ?CV:  Regular ?RESP:  Clear ?ABD:  Non-tender ?NEURO:  A/O ? ?No results found for this or any previous visit (from the past 48 hour(s)). ?No results found. ? ?Assessment/Plan ? ? 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 ?EGD, EUS +/- FNA. ?Risks (bleeding, infection, bowel perforation that could require surgery, sedation-related changes in cardiopulmonary systems), benefits (identification and possible treatment of source of symptoms, exclusion of certain causes of symptoms), and alternatives (watchful waiting, radiographic imaging studies, empiric medical treatment) of upper endoscopy, upper endoscopy with ultrasound +/- fine needle aspiration biopsies (EGD, EUS +/- FNA) were explained to patient/family in detail and patient wishes to proceed.  ? ?Landry Dyke ?12/29/2021, 9:28 AM  ?

## 2021-12-29 NOTE — Transfer of Care (Signed)
Immediate Anesthesia Transfer of Care Note ? ?Patient: Glenn Young ? ?Procedure(s) Performed: UPPER ESOPHAGEAL ENDOSCOPIC ULTRASOUND (EUS) (Bilateral) ?FINE NEEDLE ASPIRATION (FNA) LINEAR (Bilateral) ?ESOPHAGOGASTRODUODENOSCOPY (EGD) ? ?Patient Location: PACU and Endoscopy Unit ? ?Anesthesia Type:MAC ? ?Level of Consciousness: drowsy and patient cooperative ? ?Airway & Oxygen Therapy: Patient Spontanous Breathing and Patient connected to face mask oxygen ? ?Post-op Assessment: Report given to RN and Post -op Vital signs reviewed and stable ? ?Post vital signs: Reviewed and stable ? ?Last Vitals:  ?Vitals Value Taken Time  ?BP 101/72 12/29/21 1025  ?Temp    ?Pulse 67 12/29/21 1026  ?Resp 23 12/29/21 1026  ?SpO2 100 % 12/29/21 1026  ?Vitals shown include unvalidated device data. ? ?Last Pain:  ?Vitals:  ? 12/29/21 0827  ?TempSrc: Oral  ?PainSc: 0-No pain  ?   ? ?  ? ?Complications: No notable events documented. ?

## 2021-12-29 NOTE — Anesthesia Postprocedure Evaluation (Signed)
Anesthesia Post Note ? ?Patient: Omri Bertran Brizuela ? ?Procedure(s) Performed: UPPER ESOPHAGEAL ENDOSCOPIC ULTRASOUND (EUS) (Bilateral) ?FINE NEEDLE ASPIRATION (FNA) LINEAR (Bilateral) ?ESOPHAGOGASTRODUODENOSCOPY (EGD) ? ?  ? ?Patient location during evaluation: Endoscopy ?Anesthesia Type: MAC ?Level of consciousness: awake and alert ?Pain management: pain level controlled ?Vital Signs Assessment: post-procedure vital signs reviewed and stable ?Respiratory status: spontaneous breathing, nonlabored ventilation, respiratory function stable and patient connected to nasal cannula oxygen ?Cardiovascular status: blood pressure returned to baseline and stable ?Postop Assessment: no apparent nausea or vomiting ?Anesthetic complications: no ? ? ?No notable events documented. ? ?Last Vitals:  ?Vitals:  ? 12/29/21 1050 12/29/21 1100  ?BP: (!) 127/91 (!) 163/101  ?Pulse: 62 62  ?Resp: 13 17  ?Temp:    ?SpO2: 100% 100%  ?  ?Last Pain:  ?Vitals:  ? 12/29/21 1024  ?TempSrc: Temporal  ?PainSc: 0-No pain  ? ? ?  ?  ?  ?  ?  ?  ? ?Kalliope Riesen DANIEL ? ? ? ? ?

## 2021-12-29 NOTE — Op Note (Signed)
[facility] ?Patient Name: Glenn Young ?Procedure Date: 12/29/2021 ?MRN: [id number] ?Attending MD: Arta Silence , MD ?Date of Birth: 1946/02/22 ?CSN: [id number] ?Age: 76 ?Admit Type: Outpatient ?Procedure:                Upper EUS ?Indications:              Abnormal chest CT (proximal gastric nodule versus  ?                          lymph node) ?Providers:                Arta Silence, MD, Kary Kos RN, RN, Charlean Merl  ?                          Purcell Nails, Technician, Christell Faith, CRNA ?Referring MD:             Dr. Ronnette Juniper ?Medicines:                Monitored Anesthesia Care ?Complications:            No immediate complications. ?Estimated Blood Loss:     Estimated blood loss: none. ?Procedure:                Pre-Anesthesia Assessment: ?                          - Prior to the procedure, a History and Physical  ?                          was performed, and patient medications and  ?                          allergies were reviewed. The patient's tolerance of  ?                          previous anesthesia was also reviewed. The risks  ?                          and benefits of the procedure and the sedation  ?                          options and risks were discussed with the patient.  ?                          All questions were answered, and informed consent  ?                          was obtained. Prior Anticoagulants: The patient has  ?                          taken no previous anticoagulant or antiplatelet  ?                          agents. ASA Grade Assessment: III - A patient with  ?  severe systemic disease. After reviewing the risks  ?                          and benefits, the patient was deemed in  ?                          satisfactory condition to undergo the procedure. ?                          After obtaining informed consent, the endoscope was  ?                          passed under direct vision. Throughout the  ?                           procedure, the patient's blood pressure, pulse, and  ?                          oxygen saturations were monitored continuously. The  ?                          GF-UE190-[id number] ([id number]) Olympus radial ultrasound  ?                          scope was introduced through the mouth, and  ?                          advanced to the second part of duodenum. The  ?                          [id number]-[id number] ([id number]) Olympus endoscope was introduced  ?                          through the mouth, and advanced to the antrum of  ?                          the stomach. The upper EUS was accomplished without  ?                          difficulty. The patient tolerated the procedure  ?                          well. ?Scope In: ?Scope Out: ?Findings: ?     ENDOSCOPIC FINDING: : ?     Small hiatal hernia. LA Grade C (one or more mucosal breaks continuous  ?     between tops of 2 or more mucosal folds, less than 75% circumference)  ?     esophagitis was found. ?     The entire examined stomach was normal; specifically, no intramural  ?     gastric lesion seen. ?     The duodenal bulb, first portion of the duodenum and second portion of  ?     the duodenum were normal. ?     ENDOSONOGRAPHIC FINDING: : ?     A round intramural (subepithelial) lesion was  found in the fundus of the  ?     stomach and in the greater curve of the stomach. The lesion was  ?     hypoechoic. Sonographically, the lesion appeared to originate from the  ?     muscularis propria (Layer 4), the outer part of the stomach not leaving  ?     intragastric imprint endoscopically. The lesion also appeared to involve  ?     the following wall layer(s): muscularis propria (Layer 4). The lesion  ?     measured 12 mm (in maximum thickness). The lesion also measured 17 mm in  ?     diameter. The outer endosonographic borders were well defined. Fine  ?     needle aspiration for cytology was performed. Color Doppler imaging was  ?     utilized prior to needle puncture to confirm a lack  of significant  ?     vascular structures within the needle path. Three passes were made with  ?     the 22 gauge needle using a transgastric approach. A stylet was used. A  ?     preliminary cytologic examination was not performed. Final cytology  ?     results are pending. ?Impression:               - LA Grade C esophagitis. ?                          - Normal stomach. ?                          - Normal duodenal bulb, first portion of the  ?                          duodenum and second portion of the duodenum. ?                          - An intramural (subepithelial) lesion was found in  ?                          the fundus of the stomach and in the greater curve  ?                          of the stomach. The lesion appeared to arise from  ?                          muscularis propria (Layer 4) along the serosal  ?                          surface of the stomach, not leaving intraluminal  ?                          imprint. Most consistent with small GIST. Fine  ?                          needle aspiration performed. ?Moderate Sedation: ?     Not Applicable - Patient had care per Anesthesia. ?Recommendation:           - Discharge patient  to home (via wheelchair). ?                          - Resume previous diet today. ?                          - Await cytology results. ?                          - Continue present medications. ?                          - Use Prilosec (omeprazole) 20 mg PO BID until  ?                          further notice. ?                          - Return to GI clinic after studies are complete. ?                          - Return to referring physician as previously  ?                          scheduled. ?Procedure Code(s):        --- Professional --- ?                          [phone], Esophagogastroduodenoscopy, flexible,  ?                          transoral; with transendoscopic ultrasound-guided  ?                          intramural or transmural fine needle  ?                           aspiration/biopsy(s), (includes endoscopic  ?                          ultrasound examination limited to the esophagus,  ?                          stomach or duodenum, and adjacent structures) ?Diagnosis Code(s):        --- Professional --- ?                          K20.90, Esophagitis, unspecified without bleeding ?                          K31.89, Other diseases of stomach and duodenum ?                          R93.89, Abnormal findings on diagnostic imaging of  ?                          other specified body structures ?CPT copyright 2019 American  Medical Association. All rights reserved. ?The codes documented in this report are preliminary and upon coder review may  ?be revised to meet current compliance requirements. ?Arta Silence, MD ?12/29/2021 10:26:52 AM ?This report has been signed electronically. ?Number of Addenda: 0 ?

## 2021-12-29 NOTE — Discharge Instructions (Signed)
Endoscopy °Care After °Please read the instructions outlined below and refer to this sheet in the next few weeks. These discharge instructions provide you with general information on caring for yourself after you leave the hospital. Your doctor may also give you specific instructions. While your treatment has been planned according to the most current medical practices available, unavoidable complications occasionally occur. If you have any problems or questions after discharge, please call Dr. Consuelo Suthers ([facility]) at [phone]. ° °HOME CARE INSTRUCTIONS °Activity °· You may resume your regular activity but move at a slower pace for the next 24 hours.  °· Take frequent rest periods for the next 24 hours.  °· Walking will help expel (get rid of) the air and reduce the bloated feeling in your abdomen.  °· No driving for 24 hours (because of the anesthesia (medicine) used during the test).  °· You may shower.  °· Do not sign any important legal documents or operate any machinery for 24 hours (because of the anesthesia used during the test).  °Nutrition °· Drink plenty of fluids.  °· You may resume your normal diet.  °· Begin with a light meal and progress to your normal diet.  °· Avoid alcoholic beverages for 24 hours or as instructed by your caregiver.  °Medications °You may resume your normal medications unless your caregiver tells you otherwise. °What you can expect today °· You may experience abdominal discomfort such as a feeling of fullness or "gas" pains.  °· You may experience a sore throat for 2 to 3 days. This is normal. Gargling with salt water may help this.  °·  °SEEK IMMEDIATE MEDICAL CARE IF: °· You have excessive nausea (feeling sick to your stomach) and/or vomiting.  °· You have severe abdominal pain and distention (swelling).  °· You have trouble swallowing.  °· You have a temperature over 100° F (37.8° C).  °· You have rectal bleeding or vomiting of blood.  °Document Released:  05/17/2004 Document Revised: 06/15/2011 Document Reviewed: 11/28/2007 °ExitCare® Patient Information ©2012 [employer]. °

## 2021-12-30 ENCOUNTER — Encounter (HOSPITAL_COMMUNITY): Payer: Self-pay | Admitting: Gastroenterology

## 2021-12-31 LAB — CYTOLOGY - NON PAP

## 2022-02-22 DIAGNOSIS — K3189 Other diseases of stomach and duodenum: Secondary | ICD-10-CM | POA: Diagnosis not present

## 2022-02-22 DIAGNOSIS — E782 Mixed hyperlipidemia: Secondary | ICD-10-CM | POA: Diagnosis not present

## 2022-02-22 DIAGNOSIS — I10 Essential (primary) hypertension: Secondary | ICD-10-CM | POA: Diagnosis not present

## 2022-02-22 DIAGNOSIS — K635 Polyp of colon: Secondary | ICD-10-CM | POA: Diagnosis not present

## 2022-02-22 DIAGNOSIS — R030 Elevated blood-pressure reading, without diagnosis of hypertension: Secondary | ICD-10-CM | POA: Diagnosis not present

## 2022-02-22 DIAGNOSIS — I719 Aortic aneurysm of unspecified site, without rupture: Secondary | ICD-10-CM | POA: Diagnosis not present

## 2022-04-21 ENCOUNTER — Other Ambulatory Visit: Payer: Self-pay | Admitting: Thoracic Surgery (Cardiothoracic Vascular Surgery)

## 2022-05-12 ENCOUNTER — Ambulatory Visit
Admission: RE | Admit: 2022-05-12 | Discharge: 2022-05-12 | Disposition: A | Discharge status: Home / Self Care | Payer: Medicare Other | Source: Ambulatory Visit | Attending: Interventional Cardiology | Admitting: Interventional Cardiology

## 2022-05-12 DIAGNOSIS — I7121 Aneurysm of the ascending aorta, without rupture: Secondary | ICD-10-CM

## 2022-05-12 DIAGNOSIS — I2510 Atherosclerotic heart disease of native coronary artery without angina pectoris: Secondary | ICD-10-CM | POA: Diagnosis not present

## 2022-05-12 DIAGNOSIS — I700 Atherosclerosis of aorta: Secondary | ICD-10-CM | POA: Diagnosis not present

## 2022-05-12 DIAGNOSIS — I712 Thoracic aortic aneurysm, without rupture, unspecified: Secondary | ICD-10-CM | POA: Diagnosis not present

## 2022-05-12 MED ORDER — IOPAMIDOL (ISOVUE-370) INJECTION 76%
75.0000 mL | Freq: Once | INTRAVENOUS | Status: AC | PRN
Start: 2022-05-12 — End: 2022-05-12
  Administered 2022-05-12: 75 mL via INTRAVENOUS

## 2022-06-10 ENCOUNTER — Ambulatory Visit (INDEPENDENT_AMBULATORY_CARE_PROVIDER_SITE_OTHER): Payer: Medicare Other | Admitting: Thoracic Surgery (Cardiothoracic Vascular Surgery)

## 2022-06-10 DIAGNOSIS — I7121 Aneurysm of the ascending aorta, without rupture: Secondary | ICD-10-CM | POA: Diagnosis not present

## 2022-06-10 NOTE — Progress Notes (Signed)
[address]       [place] [address]             [phone]       Patient: Home Provider: Office Consent for Telemedicine visit obtained.  Today's visit was completed via a real-time telehealth (see specific modality noted below). The patient/authorized person provided oral consent at the time of the visit to engage in a telemedicine encounter with the present provider at [facility]. The patient/authorized person was informed of the potential benefits, limitations, and risks of telemedicine. The patient/authorized person expressed understanding that the laws that protect confidentiality also apply to telemedicine. The patient/authorized person acknowledged understanding that telemedicine does not provide emergency services and that he or she would need to call 911 or proceed to the nearest hospital for help if such a need arose.   Total time spent in the clinical discussion 10 minutes.  Telehealth Modality: Phone visit (audio only)  Recent Radiology Findings:   CT ANGIO CHEST AORTA W/CM & OR WO/CM  Result Date: 05/12/2022 CLINICAL DATA:  Follow-up of aneurysmal disease of the ascending thoracic aorta. EXAM: CT ANGIOGRAPHY CHEST WITH CONTRAST TECHNIQUE: Multidetector CT imaging of the chest was performed using the standard protocol during bolus administration of intravenous contrast. Multiplanar CT image reconstructions and MIPs were obtained to evaluate the vascular anatomy. RADIATION DOSE REDUCTION: This exam was performed according to the departmental dose-optimization program which includes automated exposure control, adjustment of the mA and/or kV according to patient size and/or use of iterative reconstruction technique. CONTRAST:  105m ISOVUE-370 IOPAMIDOL (ISOVUE-370) INJECTION 76% COMPARISON:  11/08/2021 FINDINGS: Cardiovascular: The aortic root measures approximately 4.3-4.6 cm at the level of the sinuses of Valsalva. The ascending thoracic aorta demonstrates  stable dilatation with maximum measured diameter of approximately 4.6 cm. Previous measurement of 4.9 cm is felt to have been slightly overestimated. The proximal arch measures 3.7 cm and the distal arch 3.1 cm. The descending thoracic aorta measures 2.9 cm. No evidence of aortic dissection. Proximal great vessels demonstrate normal patency and branching anatomy. No evidence of aortic dissection. Minimal calcified plaque at the level of the aortic arch. The heart size is normal. No pericardial fluid identified. Mild amount of calcified coronary artery plaque. Central pulmonary arteries are normal in caliber. Mediastinum/Nodes: No enlarged mediastinal, hilar, or axillary lymph nodes. Thyroid gland, trachea, and esophagus demonstrate no significant findings. Lungs/Pleura: There is no evidence of pulmonary edema, consolidation, pneumothorax, nodule or pleural fluid. Upper Abdomen: No acute abnormality. Musculoskeletal: No chest wall abnormality. No acute or significant osseous findings. Review of the MIP images confirms the above findings. 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 Ascending thoracic aortic aneurysm. Recommend semi-annual imaging followup by CTA or MRA and referral to cardiothoracic surgery if not already obtained. This recommendation follows 2010 ACCF/AHA/AATS/ACR/ASA/SCA/SCAI/SIR/STS/SVM Guidelines for the Diagnosis and Management of Patients With Thoracic Aortic Disease. Circulation. 2010; 121:: V616-W737 Aortic aneurysm NOS (ICD10-I71.9) Aortic aneurysm NOS (ICD10-I71.9). Electronically Signed   By: GAletta EdouardM.D.   On: 05/12/2022 10:48    I had a telephone visit with Glenn Young Assessment:  773with a 4.6 cm ascending aortic aneurysm.  Echocardiogram shows a tricuspid valve without evidence of regurgitation.  We discussed the natural history and and risk factors for growth of ascending aortic aneurysms.  We covered  the importance of smoking cessation, tight blood pressure control, refraining from lifting heavy objects, and avoiding  fluoroquinolones.  The patient is aware of signs and symptoms of aortic dissection and when to present to the emergency department.  We will continue surveillance and a repeat CT was ordered for 12 months.  Glenn Young

## 2022-07-07 DIAGNOSIS — I10 Essential (primary) hypertension: Secondary | ICD-10-CM | POA: Diagnosis not present

## 2022-07-07 DIAGNOSIS — I719 Aortic aneurysm of unspecified site, without rupture: Secondary | ICD-10-CM | POA: Diagnosis not present

## 2022-07-07 DIAGNOSIS — Z0000 Encounter for general adult medical examination without abnormal findings: Secondary | ICD-10-CM | POA: Diagnosis not present

## 2022-07-07 DIAGNOSIS — Z23 Encounter for immunization: Secondary | ICD-10-CM | POA: Diagnosis not present

## 2022-07-07 DIAGNOSIS — E559 Vitamin D deficiency, unspecified: Secondary | ICD-10-CM | POA: Diagnosis not present

## 2022-07-07 DIAGNOSIS — M109 Gout, unspecified: Secondary | ICD-10-CM | POA: Diagnosis not present

## 2022-07-07 DIAGNOSIS — E039 Hypothyroidism, unspecified: Secondary | ICD-10-CM | POA: Diagnosis not present

## 2022-07-07 DIAGNOSIS — Z125 Encounter for screening for malignant neoplasm of prostate: Secondary | ICD-10-CM | POA: Diagnosis not present

## 2022-07-07 DIAGNOSIS — E782 Mixed hyperlipidemia: Secondary | ICD-10-CM | POA: Diagnosis not present

## 2022-08-23 IMAGING — CT CT ANGIO CHEST
2 of 7 series · 10 of 36 positions shown · IV contrast (agent unspecified)
Comparison: CT cardiac dated July 27, 2021

CLINICAL DATA: Aortic aneurysm

EXAM:
CT ANGIOGRAPHY CHEST WITH CONTRAST
TECHNIQUE: Multidetector CT imaging of the chest was performed using the
standard protocol during bolus administration of intravenous
contrast. Multiplanar CT image reconstructions and MIPs were
obtained to evaluate the vascular anatomy.

[Series 10: cta thorax 2.00 bv36 s3 axial arterial · axial · arterial · 0.66mm/px · z∈[+1496,+1784]mm · 9 of 180 slices shown]
[im 18/180  lung]
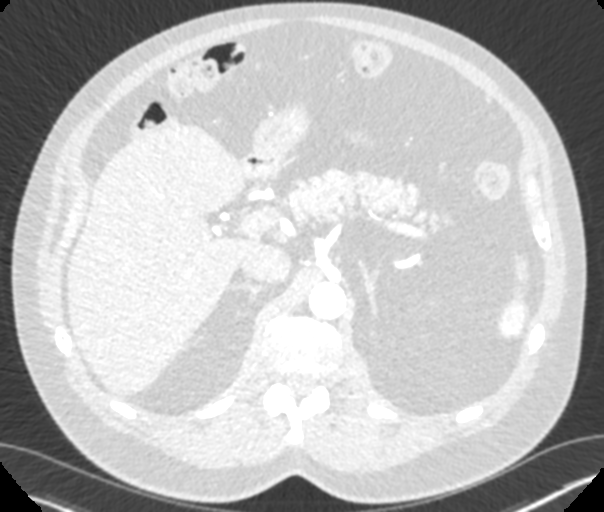
[im 36/180  mediastinal]
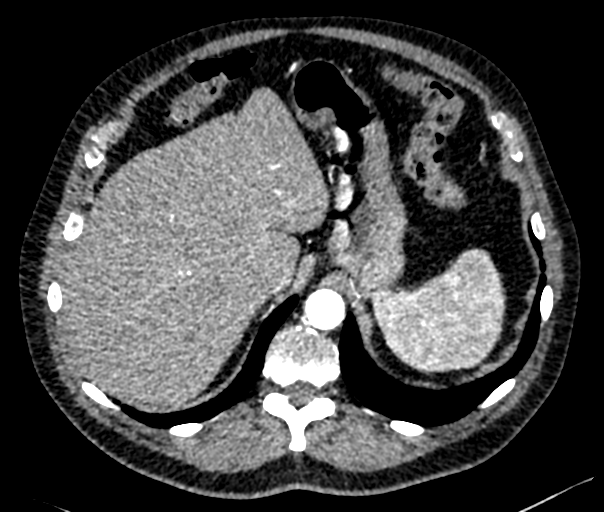
[im 54/180  lung]
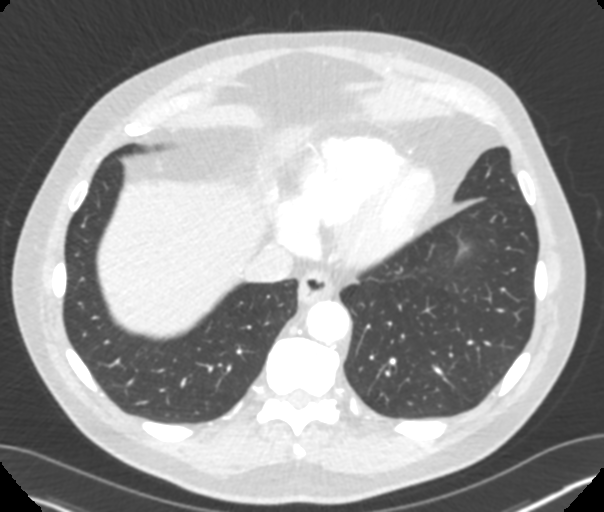
[im 72/180  mediastinal]
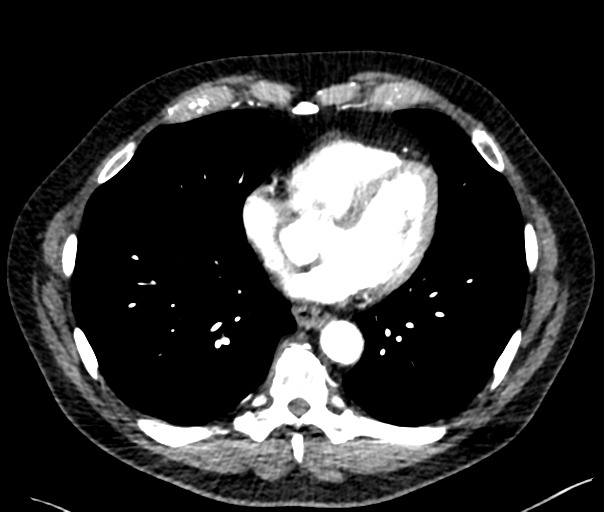
[im 90/180  lung]
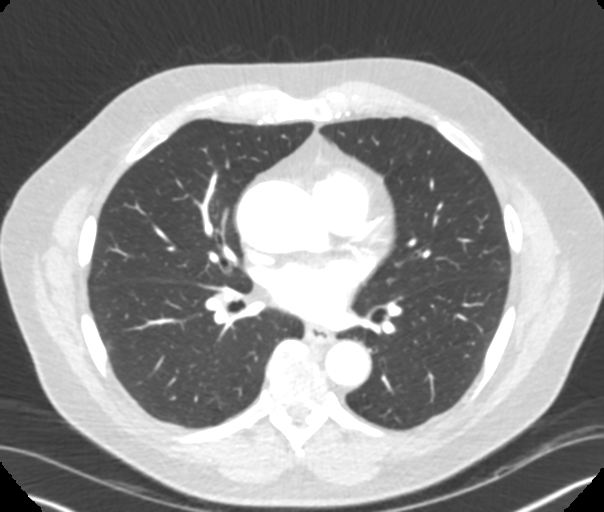
[im 108/180  mediastinal]
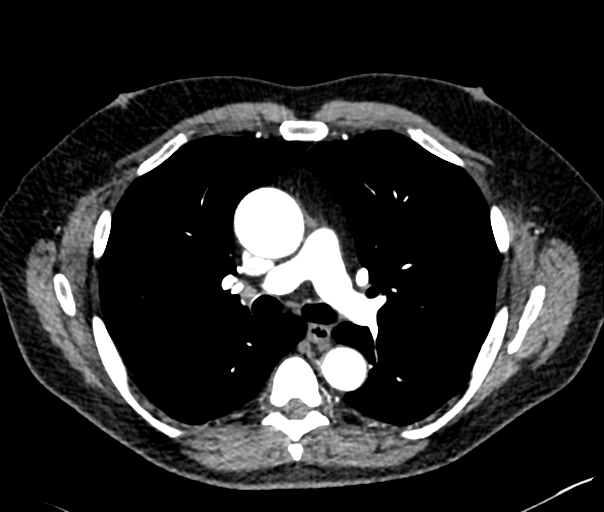
[im 126/180  lung]
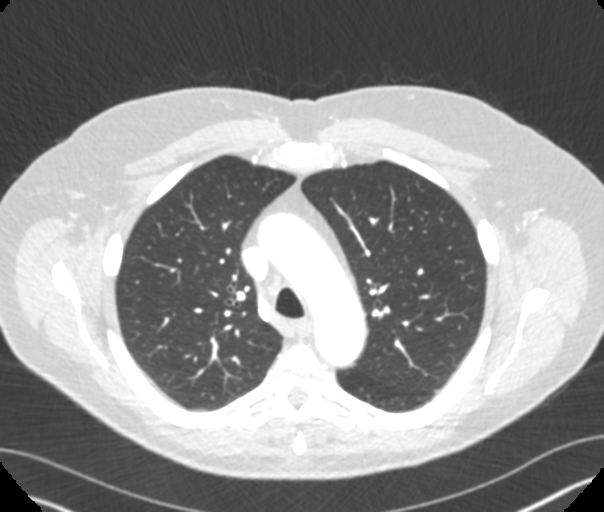
[im 144/180  mediastinal]
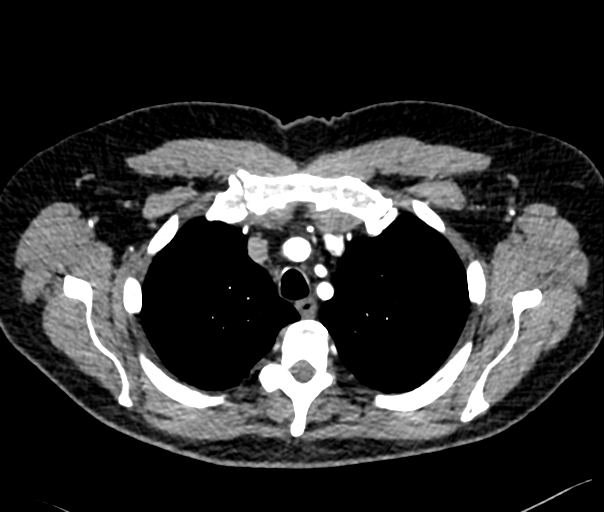
[im 162/180  lung]
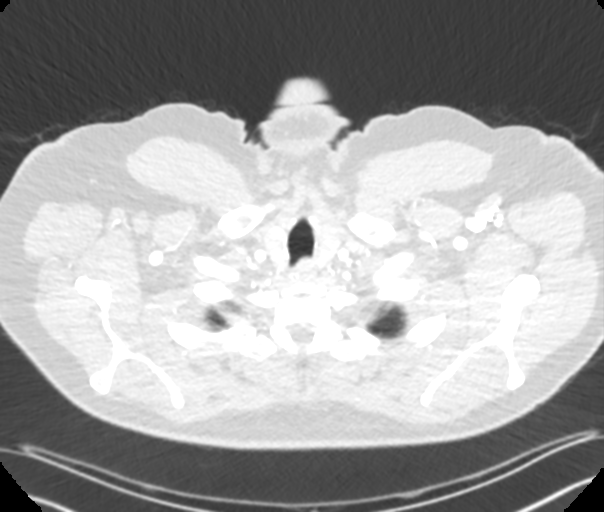

[Series 19: cta thorax 2.00 bv36 s3 cor st · coronal · 0.69mm/px · 1 of 169 slices shown]
[im 85/169  mediastinal]
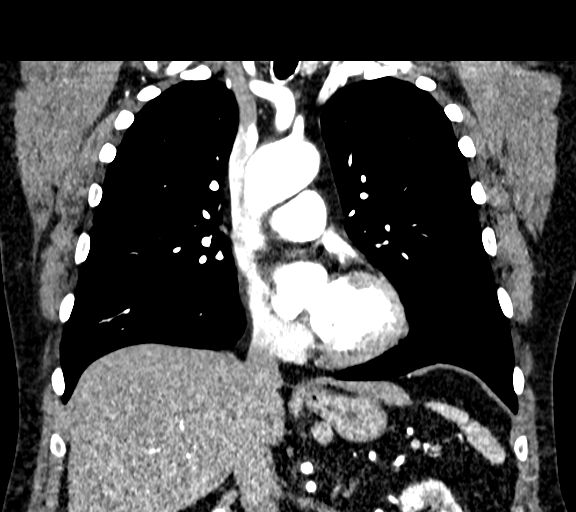

[10 of 36 positions shown; findings below may reference images not displayed]

RADIATION DOSE REDUCTION: This exam was performed according to the
departmental dose-optimization program which includes automated
exposure control, adjustment of the mA and/or kV according to
patient size and/or use of iterative reconstruction technique.

CONTRAST:  75mL TV2KTA-HJ5 IOPAMIDOL (TV2KTA-HJ5) INJECTION 76%
FINDINGS: Cardiovascular: Tricuspid aortic valve. Dilated aortic root
measuring 4.1 x 4.3 x 4.4 cm from cusp to commissure. Dilated
ascending thoracic aorta measuring up to 4.9 x 4.7 cm at the level
the main pulmonary artery. Remainder of the thoracic aorta is normal
in caliber. Normal heart size. Trace pericardial fluid. Mild
atherosclerotic disease of the thoracic aorta and mild coronary
artery calcifications. No suspicious central filling defects of the
pulmonary arteries.

Mediastinum/Nodes: Small hiatal hernia. No pathologically enlarged
lymph nodes seen in the chest.

Lungs/Pleura: Central airways are patent. No consolidation, pleural
effusion or pneumothorax.

Upper Abdomen: Soft tissue nodule of the upper abdomen which likely
arises from the stomach, measuring 1.4 x 1.4 cm on image 151.

Musculoskeletal: No chest wall abnormality. No acute or significant
osseous findings.

Review of the MIP images confirms the above findings.
IMPRESSION: 1. Dilated aortic root measuring up to 4.4 cm and ascending thoracic
aortic aneurysm measuring up to 4.9 cm, unchanged in size when
compared to prior exam.
2. Soft tissue nodule of the upper abdomen which possibly arises
from the stomach, differential considerations include exophytic
gastrointestinal stromal tumor or enlarged lymph node. Recommend
EUS/FNA for further evaluation.

These results will be called to the ordering clinician or
representative by the Radiologist Assistant, and communication
documented in the PACS or [REDACTED].

## 2022-09-20 DIAGNOSIS — M25551 Pain in right hip: Secondary | ICD-10-CM | POA: Diagnosis not present

## 2022-10-19 DIAGNOSIS — M25551 Pain in right hip: Secondary | ICD-10-CM | POA: Diagnosis not present

## 2022-10-20 DIAGNOSIS — L821 Other seborrheic keratosis: Secondary | ICD-10-CM | POA: Diagnosis not present

## 2022-10-20 DIAGNOSIS — D485 Neoplasm of uncertain behavior of skin: Secondary | ICD-10-CM | POA: Diagnosis not present

## 2022-10-20 DIAGNOSIS — L738 Other specified follicular disorders: Secondary | ICD-10-CM | POA: Diagnosis not present

## 2022-10-24 ENCOUNTER — Ambulatory Visit (INDEPENDENT_AMBULATORY_CARE_PROVIDER_SITE_OTHER): Payer: Medicare Other | Admitting: Orthopaedic Surgery

## 2022-10-24 VITALS — Ht 71.0 in | Wt 220.0 lb

## 2022-10-24 DIAGNOSIS — M25551 Pain in right hip: Secondary | ICD-10-CM | POA: Diagnosis not present

## 2022-10-24 DIAGNOSIS — M1611 Unilateral primary osteoarthritis, right hip: Secondary | ICD-10-CM | POA: Diagnosis not present

## 2022-10-24 NOTE — Progress Notes (Signed)
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  He has been followed by another orthopedic surgeon for some time now who has retired from surgery.  He was sent to me to discuss anterior hip surgery.  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  He is very active.  He does want to pursue hip replacement surgery at some point later this year.  He is going on a ski trip soon.  He said he does not hurt when he skis but he pays for the day or 2 later in terms of the pain from his right hip.  It does not hurt on a daily basis but is getting to where it is hurting him enough.  He had a steroid injection in the right hip joint just a week ago and he said that is helped.  He is otherwise someone who is healthy.  He is not diabetic.  He does take Celebrex on occasion.  There is no listed headache, chest pain, shortness of breath, fever, chills, nausea, vomiting.  I did review his notes and medical history within epic.  On exam his right hip has some stiffness with internal ex rotation and pain in the groin with rotation.  The left hip moves smoothly and fluidly.  X-rays that accompany him show end-stage arthritis of the right hip with superior lateral joint space narrowing and periarticular osteophytes and slight flattening of the femoral head.  His left hip appears normal.  I did talk with him in length in detail about hip replacement surgery.  I discussed the risks and benefits of the surgery.  We talked about what to expect from an intraoperative and postoperative course.  I gave him a handout about hip replacement surgery as well.  All questions and concerns were answered and addressed.  I did give him my cell phone number as well.  He will call us when he decides about scheduling surgery.  All question concerns were answered and addressed.

## 2022-10-25 DIAGNOSIS — H2513 Age-related nuclear cataract, bilateral: Secondary | ICD-10-CM | POA: Diagnosis not present

## 2022-10-25 DIAGNOSIS — H524 Presbyopia: Secondary | ICD-10-CM | POA: Diagnosis not present

## 2022-12-06 ENCOUNTER — Telehealth: Payer: Self-pay | Admitting: Orthopaedic Surgery

## 2022-12-06 NOTE — Telephone Encounter (Signed)
Patient would like to discuss rt hip surgery. Best contact [phone]

## 2022-12-06 NOTE — Telephone Encounter (Signed)
I called pt. He is ready to schedule sx. Wants sometime around Easter. Please cb to discuss, thank you

## 2022-12-06 NOTE — Telephone Encounter (Signed)
Do you think I need to get any clearances?

## 2022-12-14 NOTE — Telephone Encounter (Signed)
I called patient and scheduled surgery. 

## 2023-02-03 NOTE — Progress Notes (Signed)
Sent message, via epic in basket, requesting orders in epic from surgeon.  

## 2023-02-03 NOTE — Progress Notes (Signed)
Please place orders for PAT visit 02/07/23.

## 2023-02-03 NOTE — Patient Instructions (Addendum)
SURGICAL WAITING ROOM VISITATION  Patients having surgery or a procedure may have no more than 2 support people in the waiting area - these visitors may rotate.    Children under the age of 24 must have an adult with them who is not the patient.  Due to an increase in RSV and influenza rates and associated hospitalizations, children ages 23 and under may not visit patients in [facility] hospitals.  If the patient needs to stay at the hospital during part of their recovery, the visitor guidelines for inpatient rooms apply. Pre-op nurse will coordinate an appropriate time for 1 support person to accompany patient in pre-op.  This support person may not rotate.    Please refer to the [facility] website for the visitor guidelines for Inpatients (after your surgery is over and you are in a regular room).    Your procedure is scheduled on: 02/17/23   Report to [name]    Report to admitting at 7:30 AM   Call this number if you have problems the morning of surgery [phone]   Do not eat food :After Midnight.   After Midnight you may have the following liquids until 7:00 AM DAY OF SURGERY  Water Non-Citrus Juices (without pulp, NO RED-Apple, White grape, White cranberry) Black Coffee (NO MILK/CREAM OR CREAMERS, sugar ok)  Clear Tea (NO MILK/CREAM OR CREAMERS, sugar ok) regular and decaf                             Plain Jell-O (NO RED)                                           Fruit ices (not with fruit pulp, NO RED)                                     Popsicles (NO RED)                                                               Sports drinks like Gatorade (NO RED)              Drink 2 Ensure/G2 drinks AT 10:00 PM the night before surgery.        The day of surgery:  Drink ONE (1) Pre-Surgery Clear Ensure at 7:00 AM the morning of surgery. Drink in one sitting. Do not sip.  This drink was given to you during your hospital  pre-op appointment  visit. Nothing else to drink after completing the  Pre-Surgery Clear Ensure.          If you have questions, please contact your surgeon's office.   FOLLOW BOWEL PREP AND ANY ADDITIONAL PRE OP INSTRUCTIONS YOU RECEIVED FROM YOUR SURGEON'S OFFICE!!!     Oral Hygiene is also important to reduce your risk of infection.                                    Remember -  BRUSH YOUR TEETH THE MORNING OF SURGERY WITH YOUR REGULAR TOOTHPASTE  DENTURES WILL BE REMOVED PRIOR TO SURGERY PLEASE DO NOT APPLY "Poly grip" OR ADHESIVES!!!   Take these medicines the morning of surgery with A SIP OF WATER: Atorvastatin, Colchicine, Levothyroxine,                               You may not have any metal on your body including jewelry, and body piercing             Do not wear lotions, powders, cologne, or deodorant  Do not shave  48 hours prior to surgery.               Men may shave face and neck.   Do not bring valuables to the hospital. [facility] IS NOT             RESPONSIBLE   FOR VALUABLES.   Contacts, glasses, dentures or bridgework may not be worn into surgery.   Bring small overnight bag day of surgery.   DO NOT BRING YOUR HOME MEDICATIONS TO THE HOSPITAL. PHARMACY WILL DISPENSE MEDICATIONS LISTED ON YOUR MEDICATION LIST TO YOU DURING YOUR ADMISSION IN THE HOSPITAL!   Special Instructions: Bring a copy of your healthcare power of attorney and living will documents the day of surgery if you haven't scanned them before.              Please read over the following fact sheets you were given: IF YOU HAVE QUESTIONS ABOUT YOUR PRE-OP INSTRUCTIONS PLEASE CALL [name]    If you received a COVID test during your pre-op visit  it is requested that you wear a mask when out in public, stay away from anyone that may not be feeling well and notify your surgeon if you develop symptoms. If you test positive for Covid or have been in contact with anyone that has tested positive in the last 10  days please notify you surgeon.    [place] - Preparing for Surgery Before surgery, you can play an important role.  Because skin is not sterile, your skin needs to be as free of germs as possible.  You can reduce the number of germs on your skin by washing with CHG (chlorahexidine gluconate) soap before surgery.  CHG is an antiseptic cleaner which kills germs and bonds with the skin to continue killing germs even after washing. Please DO NOT use if you have an allergy to CHG or antibacterial soaps.  If your skin becomes reddened/irritated stop using the CHG and inform your nurse when you arrive at Short Stay. Do not shave (including legs and underarms) for at least 48 hours prior to the first CHG shower.  You may shave your face/neck.  Please follow these instructions carefully:  1.  Shower with CHG Soap the night before surgery and the  morning of surgery.  2.  If you choose to wash your hair, wash your hair first as usual with your normal  shampoo.  3.  After you shampoo, rinse your hair and body thoroughly to remove the shampoo.                             4.  Use CHG as you would any other liquid soap.  You can apply chg directly to the skin and wash.  Gently with a scrungie or  clean washcloth.  5.  Apply the CHG Soap to your body ONLY FROM THE NECK DOWN.   Do   not use on face/ open                           Wound or open sores. Avoid contact with eyes, ears mouth and   genitals (private parts).                       Wash face,  Genitals (private parts) with your normal soap.             6.  Wash thoroughly, paying special attention to the area where your    surgery  will be performed.  7.  Thoroughly rinse your body with warm water from the neck down.  8.  DO NOT shower/wash with your normal soap after using and rinsing off the CHG Soap.                9.  Pat yourself dry with a clean towel.            10.  Wear clean pajamas.            11.  Place clean sheets on your bed the night of  your first shower and do not  sleep with pets. Day of Surgery : Do not apply any lotions/deodorants the morning of surgery.  Please wear clean clothes to the hospital/surgery center.  FAILURE TO FOLLOW THESE INSTRUCTIONS MAY RESULT IN THE CANCELLATION OF YOUR SURGERY  PATIENT SIGNATURE_________________________________  NURSE SIGNATURE__________________________________  ________________________________________________________________________  WHAT IS A BLOOD TRANSFUSION? Blood Transfusion Information  A transfusion is the replacement of blood or some of its parts. Blood is made up of multiple cells which provide different functions. Red blood cells carry oxygen and are used for blood loss replacement. White blood cells fight against infection. Platelets control bleeding. Plasma helps clot blood. Other blood products are available for specialized needs, such as hemophilia or other clotting disorders. BEFORE THE TRANSFUSION  Who gives blood for transfusions?  Healthy volunteers who are fully evaluated to make sure their blood is safe. This is blood bank blood. Transfusion therapy is the safest it has ever been in the practice of medicine. Before blood is taken from a donor, a complete history is taken to make sure that person has no history of diseases nor engages in risky social behavior (examples are intravenous drug use or sexual activity with multiple partners). The donor's travel history is screened to minimize risk of transmitting infections, such as malaria. The donated blood is tested for signs of infectious diseases, such as HIV and hepatitis. The blood is then tested to be sure it is compatible with you in order to minimize the chance of a transfusion reaction. If you or a relative donates blood, this is often done in anticipation of surgery and is not appropriate for emergency situations. It takes many days to process the donated blood. RISKS AND COMPLICATIONS Although transfusion  therapy is very safe and saves many lives, the main dangers of transfusion include:  Getting an infectious disease. Developing a transfusion reaction. This is an allergic reaction to something in the blood you were given. Every precaution is taken to prevent this. The decision to have a blood transfusion has been considered carefully by your caregiver before blood is given. Blood is not given unless the benefits outweigh the risks. AFTER THE TRANSFUSION  Right after receiving a blood transfusion, you will usually feel much better and more energetic. This is especially true if your red blood cells have gotten low (anemic). The transfusion raises the level of the red blood cells which carry oxygen, and this usually causes an energy increase. The nurse administering the transfusion will monitor you carefully for complications. HOME CARE INSTRUCTIONS  No special instructions are needed after a transfusion. You may find your energy is better. Speak with your caregiver about any limitations on activity for underlying diseases you may have. SEEK MEDICAL CARE IF:  Your condition is not improving after your transfusion. You develop redness or irritation at the intravenous (IV) site. SEEK IMMEDIATE MEDICAL CARE IF:  Any of the following symptoms occur over the next 12 hours: Shaking chills. You have a temperature by mouth above 102 F (38.9 C), not controlled by medicine. Chest, back, or muscle pain. People around you feel you are not acting correctly or are confused. Shortness of breath or difficulty breathing. Dizziness and fainting. You get a rash or develop hives. You have a decrease in urine output. Your urine turns a dark color or changes to pink, red, or brown. Any of the following symptoms occur over the next 10 days: You have a temperature by mouth above 102 F (38.9 C), not controlled by medicine. Shortness of breath. Weakness after normal activity. The white part of the eye turns yellow  (jaundice). You have a decrease in the amount of urine or are urinating less often. Your urine turns a dark color or changes to pink, red, or brown. Document Released: 09/30/2000 Document Revised: 12/26/2011 Document Reviewed: 05/19/2008 [facility] Patient Information 2014 [place], [place].   Incentive Spirometer  An incentive spirometer is a tool that can help keep your lungs clear and active. This tool measures how well you are filling your lungs with each breath. Taking long deep breaths may help reverse or decrease the chance of developing breathing (pulmonary) problems (especially infection) following: A long period of time when you are unable to move or be active. BEFORE THE PROCEDURE  If the spirometer includes an indicator to show your best effort, your nurse or respiratory therapist will set it to a desired goal. If possible, sit up straight or lean slightly forward. Try not to slouch. Hold the incentive spirometer in an upright position. INSTRUCTIONS FOR USE  Sit on the edge of your bed if possible, or sit up as far as you can in bed or on a chair. Hold the incentive spirometer in an upright position. Breathe out normally. Place the mouthpiece in your mouth and seal your lips tightly around it. Breathe in slowly and as deeply as possible, raising the piston or the ball toward the top of the column. Hold your breath for 3-5 seconds or for as long as possible. Allow the piston or ball to fall to the bottom of the column. Remove the mouthpiece from your mouth and breathe out normally. Rest for a few seconds and repeat Steps 1 through 7 at least 10 times every 1-2 hours when you are awake. Take your time and take a few normal breaths between deep breaths. The spirometer may include an indicator to show your best effort. Use the indicator as a goal to work toward during each repetition. After each set of 10 deep breaths, practice coughing to be sure your lungs are clear. If you have an  incision (the cut made at the time of surgery), support your incision when coughing  by placing a pillow or rolled up towels firmly against it. Once you are able to get out of bed, walk around indoors and cough well. You may stop using the incentive spirometer when instructed by your caregiver.  RISKS AND COMPLICATIONS Take your time so you do not get dizzy or light-headed. If you are in pain, you may need to take or ask for pain medication before doing incentive spirometry. It is harder to take a deep breath if you are having pain. AFTER USE Rest and breathe slowly and easily. It can be helpful to keep track of a log of your progress. Your caregiver can provide you with a simple table to help with this. If you are using the spirometer at home, follow these instructions: SEEK MEDICAL CARE IF:  You are having difficultly using the spirometer. You have trouble using the spirometer as often as instructed. Your pain medication is not giving enough relief while using the spirometer. You develop fever of 100.5 F (38.1 C) or higher. SEEK IMMEDIATE MEDICAL CARE IF:  You cough up bloody sputum that had not been present before. You develop fever of 102 F (38.9 C) or greater. You develop worsening pain at or near the incision site. MAKE SURE YOU:  Understand these instructions. Will watch your condition. Will get help right away if you are not doing well or get worse. Document Released: 02/13/2007 Document Revised: 12/26/2011 Document Reviewed: 04/16/2007 [facility] Patient Information 2014 [place], [place].   ________________________________________________________________________

## 2023-02-03 NOTE — Progress Notes (Addendum)
COVID Vaccine Completed:  Date of COVID positive in last 90 days:  PCP - Marden [place], MD Cardiologist - Lance Muss, MD LOV 09/30/21  Recently stopped Metoprolol??  Chest x-ray - CT 05/12/22 Epic EKG -  Stress Test -  ECHO - 11/09/21 Epic Cardiac Cath -  Pacemaker/ICD device last checked: Spinal Cord Stimulator:  Bowel Prep -   Sleep Study -  CPAP -   Fasting Blood Sugar -  Checks Blood Sugar _____ times a day  Last dose of GLP1 agonist-  N/A GLP1 instructions:  N/A   Last dose of SGLT-2 inhibitors-  N/A SGLT-2 instructions: N/A   Blood Thinner Instructions:  Time Aspirin Instructions: Last Dose:  Activity level:  Can go up a flight of stairs and perform activities of daily living without stopping and without symptoms of chest pain or shortness of breath.  Able to exercise without symptoms  Unable to go up a flight of stairs without symptoms of     Anesthesia review: HTN, dilation of aorta, mild coronary artery calcification   Patient denies shortness of breath, fever, cough and chest pain at PAT appointment  Patient verbalized understanding of instructions that were given to them at the PAT appointment. Patient was also instructed that they will need to review over the PAT instructions again at home before surgery.

## 2023-02-07 ENCOUNTER — Other Ambulatory Visit: Payer: Self-pay

## 2023-02-07 ENCOUNTER — Encounter (HOSPITAL_COMMUNITY): Payer: Self-pay

## 2023-02-07 ENCOUNTER — Encounter (HOSPITAL_COMMUNITY)
Admission: RE | Admit: 2023-02-07 | Discharge: 2023-02-07 | Disposition: A | Discharge status: Home / Self Care | Payer: Medicare Other | Source: Ambulatory Visit | Attending: Orthopaedic Surgery | Admitting: Orthopaedic Surgery

## 2023-02-07 ENCOUNTER — Other Ambulatory Visit: Payer: Self-pay | Admitting: Physician Assistant

## 2023-02-07 VITALS — BP 149/95 | HR 83 | Temp 97.7°F | Resp 14 | Ht 70.0 in | Wt 227.0 lb

## 2023-02-07 DIAGNOSIS — Z01818 Encounter for other preprocedural examination: Secondary | ICD-10-CM | POA: Diagnosis not present

## 2023-02-07 DIAGNOSIS — I10 Essential (primary) hypertension: Secondary | ICD-10-CM | POA: Insufficient documentation

## 2023-02-07 LAB — BASIC METABOLIC PANEL
Anion gap: 10 (ref 5–15)
BUN: 25 mg/dL — ABNORMAL HIGH (ref 8–23)
CO2: 23 mmol/L (ref 22–32)
Calcium: 9.1 mg/dL (ref 8.9–10.3)
Chloride: 105 mmol/L (ref 98–111)
Creatinine, Ser: 1.46 mg/dL — ABNORMAL HIGH (ref 0.61–1.24)
GFR, Estimated: 50 mL/min — ABNORMAL LOW (ref >60)
Glucose, Bld: 109 mg/dL — ABNORMAL HIGH (ref 70–99)
Potassium: 4.4 mmol/L (ref 3.5–5.1)
Sodium: 138 mmol/L (ref 135–145)

## 2023-02-07 LAB — CBC
HCT: 47.6 % (ref 39.0–52.0)
Hemoglobin: 15.9 g/dL (ref 13.0–17.0)
MCH: 29.7 pg (ref 26.0–34.0)
MCHC: 33.4 g/dL (ref 30.0–36.0)
MCV: 89 fL (ref 80.0–100.0)
Platelets: 247 10*3/uL (ref 150–400)
RBC: 5.35 MIL/uL (ref 4.22–5.81)
RDW: 12.8 % (ref 11.5–15.5)
WBC: 9.1 10*3/uL (ref 4.0–10.5)
nRBC: 0 % (ref 0.0–0.2)

## 2023-02-07 LAB — SURGICAL PCR SCREEN
MRSA, PCR: NEGATIVE
Staphylococcus aureus: NEGATIVE

## 2023-02-07 LAB — TYPE AND SCREEN: ABO/RH(D): O POS

## 2023-02-16 ENCOUNTER — Telehealth: Payer: Self-pay | Admitting: *Deleted

## 2023-02-16 NOTE — Care Plan (Signed)
[facility] RNCM call to patient to discuss his upcoming Right total hip arthroplasty with Dr. Magnus Ivan on 02/17/23. He is a [facility] patient and is agreeable to case management. He lives with his spouse and she will be able to assist after discharge. He does have stairs to enter his home as well as a flight of stairs to his bedroom on the 2nd level. He will need a RW prior to discharge from the hospital. Anticipate HHPT will be needed after a short hospital stay. Referral made to [facility] after choice provided. Reviewed post op instructions. Will continue to follow for needs.

## 2023-02-16 NOTE — Telephone Encounter (Signed)
[facility] pre-op call completed. 

## 2023-02-16 NOTE — Anesthesia Preprocedure Evaluation (Addendum)
Anesthesia Evaluation  Patient identified by MRN, date of birth, ID band Patient awake    Reviewed: Allergy & Precautions, H&P , NPO status , Patient's Chart, lab work & pertinent test results  Airway Mallampati: II  TM Distance: >3 FB Neck ROM: Full    Dental no notable dental hx. (+) Teeth Intact, Dental Advisory Given   Pulmonary neg pulmonary ROS, former smoker   Pulmonary exam normal breath sounds clear to auscultation       Cardiovascular Exercise Tolerance: Good hypertension, Pt. on medications  Rhythm:Regular Rate:Normal     Neuro/Psych negative neurological ROS  negative psych ROS   GI/Hepatic Neg liver ROS,GERD  ,,  Endo/Other  Hypothyroidism    Renal/GU negative Renal ROS  negative genitourinary   Musculoskeletal  (+) Arthritis , Osteoarthritis,    Abdominal   Peds  Hematology negative hematology ROS (+)   Anesthesia Other Findings   Reproductive/Obstetrics negative OB ROS                             Anesthesia Physical Anesthesia Plan  ASA: 2  Anesthesia Plan: Spinal   Post-op Pain Management: Tylenol PO (pre-op)*   Induction: Intravenous  PONV Risk Score and Plan: 2 and Propofol infusion, Ondansetron and Dexamethasone  Airway Management Planned: Natural Airway and Simple Face Mask  Additional Equipment:   Intra-op Plan:   Post-operative Plan:   Informed Consent: I have reviewed the patients History and Physical, chart, labs and discussed the procedure including the risks, benefits and alternatives for the proposed anesthesia with the patient or authorized representative who has indicated his/her understanding and acceptance.     Dental advisory given  Plan Discussed with: CRNA  Anesthesia Plan Comments:        Anesthesia Quick Evaluation

## 2023-02-16 NOTE — H&P (Signed)
TOTAL HIP ADMISSION H&P  Patient is admitted for right total hip arthroplasty.  Subjective:  Chief Complaint: right hip pain  HPI: [facility], 77 y.o. male, has a history of pain and functional disability in the right hip(s) due to arthritis and patient has failed non-surgical conservative treatments for greater than 12 weeks to include NSAID's and/or analgesics, corticosteriod injections, flexibility and strengthening excercises, and activity modification.  Onset of symptoms was gradual starting 2 years ago with gradually worsening course since that time.The patient noted no past surgery on the right hip(s).  Patient currently rates pain in the right hip at 10 out of 10 with activity. Patient has night pain, worsening of pain with activity and weight bearing, pain that interfers with activities of daily living, and pain with passive range of motion. Patient has evidence of subchondral sclerosis, periarticular osteophytes, and joint space narrowing by imaging studies. This condition presents safety issues increasing the risk of falls.  There is no current active infection.  Patient Active Problem List   Diagnosis Date Noted   Unilateral primary osteoarthritis, right hip 10/24/2022   Past Medical History:  Diagnosis Date   Ascending aorta enlargement (HCC)    Dilation of aorta (HCC)    Diverticulosis    Elevated creatine kinase level    Elevated white blood cell count    Family history of clotting disorder    GERD (gastroesophageal reflux disease)    Gout    Hyperlipidemia    Hypertension    Hypothyroidism    Paralysis (HCC)    pt deneis   Sinusitis    Vitamin D deficiency     Past Surgical History:  Procedure Laterality Date   ESOPHAGOGASTRODUODENOSCOPY N/A 12/29/2021   Procedure: ESOPHAGOGASTRODUODENOSCOPY (EGD);  Surgeon: Willis Modena, MD;  Location: [facility];  Service: Endoscopy;  Laterality: N/A;   FINE NEEDLE ASPIRATION Bilateral 12/29/2021   Procedure: FINE NEEDLE  ASPIRATION (FNA) LINEAR;  Surgeon: Willis Modena, MD;  Location: [facility];  Service: Endoscopy;  Laterality: Bilateral;   UPPER ESOPHAGEAL ENDOSCOPIC ULTRASOUND (EUS) Bilateral 12/29/2021   Procedure: UPPER ESOPHAGEAL ENDOSCOPIC ULTRASOUND (EUS);  Surgeon: Willis Modena, MD;  Location: [facility];  Service: Endoscopy;  Laterality: Bilateral;    No current facility-administered medications for this encounter.   Current Outpatient Medications  Medication Sig Dispense Refill Last Dose   amLODipine-valsartan (EXFORGE) 5-160 MG tablet Take 1 tablet by mouth daily.      atorvastatin (LIPITOR) 20 MG tablet Take 20 mg by mouth 3 (three) times a week. TAKE WITH PROBIOTIC      celecoxib (CELEBREX) 100 MG capsule Take 100 mg by mouth daily.      Cholecalciferol (VITAMIN D3) 50 MCG (2000 UT) TABS Take 2,000 Units by mouth daily.      Colchicine 0.6 MG CAPS Take 0.6 mg by mouth daily as needed (gout attack).      levothyroxine (SYNTHROID) 100 MCG tablet Take 100 mcg by mouth daily before breakfast.      Probiotic Product (PROBIOTIC DAILY PO) Take 1 capsule by mouth every other day.      No Known Allergies  Social History   Tobacco Use   Smoking status: Former    Types: Cigarettes    Passive exposure: Never   Smokeless tobacco: Never  Substance Use Topics   Alcohol use: Yes    Alcohol/week: 3.0 standard drinks of alcohol    Types: 3 Standard drinks or equivalent per week    No family history on file.  Review of Systems  All other systems reviewed and are negative.   Objective:  Physical Exam Vitals reviewed.  Constitutional:      Appearance: Normal appearance. He is normal weight.  HENT:     Head: Normocephalic and atraumatic.  Eyes:     Extraocular Movements: Extraocular movements intact.     Pupils: Pupils are equal, round, and reactive to light.  Cardiovascular:     Rate and Rhythm: Normal rate and regular rhythm.  Pulmonary:     Effort: Pulmonary effort is normal.      Breath sounds: Normal breath sounds.  Abdominal:     Palpations: Abdomen is soft.  Musculoskeletal:     Right hip: Tenderness and bony tenderness present. Decreased range of motion. Decreased strength.  Neurological:     Mental Status: He is alert and oriented to person, place, and time.  Psychiatric:        Behavior: Behavior normal.     Vital signs in last 24 hours:    Labs:   Estimated body mass index is 32.57 kg/m as calculated from the following:   Height as of 02/07/23: 5\' 10"  (1.778 m).   Weight as of 02/07/23: 103 kg.   Imaging Review Plain radiographs demonstrate severe degenerative joint disease of the right hip(s). The bone quality appears to be excellent for age and reported activity level.      Assessment/Plan:  End stage arthritis, right hip(s)  The patient history, physical examination, clinical judgement of the provider and imaging studies are consistent with end stage degenerative joint disease of the right hip(s) and total hip arthroplasty is deemed medically necessary. The treatment options including medical management, injection therapy, arthroscopy and arthroplasty were discussed at length. The risks and benefits of total hip arthroplasty were presented and reviewed. The risks due to aseptic loosening, infection, stiffness, dislocation/subluxation,  thromboembolic complications and other imponderables were discussed.  The patient acknowledged the explanation, agreed to proceed with the plan and consent was signed. Patient is being admitted for inpatient treatment for surgery, pain control, PT, OT, prophylactic antibiotics, VTE prophylaxis, progressive ambulation and ADL's and discharge planning.The patient is planning to be discharged home with home health services

## 2023-02-17 ENCOUNTER — Encounter (HOSPITAL_COMMUNITY)
Admission: RE | Disposition: A | Discharge status: Home Health | Payer: Self-pay | Source: Home / Self Care | Attending: Orthopaedic Surgery

## 2023-02-17 ENCOUNTER — Ambulatory Visit (HOSPITAL_COMMUNITY): Payer: Medicare Other | Admitting: Anesthesiology

## 2023-02-17 ENCOUNTER — Encounter (HOSPITAL_COMMUNITY): Payer: Self-pay | Admitting: Orthopaedic Surgery

## 2023-02-17 ENCOUNTER — Observation Stay (HOSPITAL_COMMUNITY): Payer: Medicare Other

## 2023-02-17 ENCOUNTER — Ambulatory Visit (HOSPITAL_COMMUNITY): Payer: Medicare Other

## 2023-02-17 ENCOUNTER — Other Ambulatory Visit: Payer: Self-pay

## 2023-02-17 ENCOUNTER — Observation Stay (HOSPITAL_COMMUNITY)
Admission: RE | Admit: 2023-02-17 | Discharge: 2023-02-18 | Disposition: A | Discharge status: Home Health | Payer: Medicare Other | Attending: Orthopaedic Surgery | Admitting: Orthopaedic Surgery

## 2023-02-17 ENCOUNTER — Ambulatory Visit (HOSPITAL_BASED_OUTPATIENT_CLINIC_OR_DEPARTMENT_OTHER): Payer: Medicare Other | Admitting: Anesthesiology

## 2023-02-17 DIAGNOSIS — Z471 Aftercare following joint replacement surgery: Secondary | ICD-10-CM | POA: Diagnosis not present

## 2023-02-17 DIAGNOSIS — I10 Essential (primary) hypertension: Secondary | ICD-10-CM

## 2023-02-17 DIAGNOSIS — Z01818 Encounter for other preprocedural examination: Secondary | ICD-10-CM

## 2023-02-17 DIAGNOSIS — Z87891 Personal history of nicotine dependence: Secondary | ICD-10-CM | POA: Diagnosis not present

## 2023-02-17 DIAGNOSIS — M1611 Unilateral primary osteoarthritis, right hip: Secondary | ICD-10-CM | POA: Diagnosis not present

## 2023-02-17 DIAGNOSIS — E039 Hypothyroidism, unspecified: Secondary | ICD-10-CM | POA: Insufficient documentation

## 2023-02-17 DIAGNOSIS — Z96641 Presence of right artificial hip joint: Secondary | ICD-10-CM

## 2023-02-17 DIAGNOSIS — Z79899 Other long term (current) drug therapy: Secondary | ICD-10-CM | POA: Diagnosis not present

## 2023-02-17 HISTORY — PX: TOTAL HIP ARTHROPLASTY: SHX124

## 2023-02-17 LAB — TYPE AND SCREEN: Antibody Screen: NEGATIVE

## 2023-02-17 LAB — ABO/RH: ABO/RH(D): O POS

## 2023-02-17 SURGERY — ARTHROPLASTY, HIP, TOTAL, ANTERIOR APPROACH
Anesthesia: Spinal | Site: Hip | Laterality: Right

## 2023-02-17 MED ORDER — LIDOCAINE 2% (20 MG/ML) 5 ML SYRINGE
INTRAMUSCULAR | Status: DC | PRN
  Administered 2023-02-17: 60 mg via INTRAVENOUS

## 2023-02-17 MED ORDER — MIDAZOLAM HCL 5 MG/5ML IJ SOLN
INTRAMUSCULAR | Status: DC | PRN
  Administered 2023-02-17: 2 mg via INTRAVENOUS

## 2023-02-17 MED ORDER — IRBESARTAN 150 MG PO TABS
150.0000 mg | ORAL_TABLET | Freq: Every day | ORAL | Status: DC
  Administered 2023-02-17 – 2023-02-18 (×2): 150 mg via ORAL
  Filled 2023-02-17 (×2): qty 1

## 2023-02-17 MED ORDER — ONDANSETRON HCL 4 MG/2ML IJ SOLN: 4.0000 mg | Freq: Four times a day (QID) | INTRAMUSCULAR | Status: DC | PRN

## 2023-02-17 MED ORDER — PHENOL 1.4 % MT LIQD: 1.0000 | OROMUCOSAL | Status: DC | PRN

## 2023-02-17 MED ORDER — PROPOFOL 500 MG/50ML IV EMUL
INTRAVENOUS | Status: DC | PRN
  Administered 2023-02-17: 40 ug/kg/min via INTRAVENOUS

## 2023-02-17 MED ORDER — ACETAMINOPHEN 500 MG PO TABS
1000.0000 mg | ORAL_TABLET | Freq: Once | ORAL | Status: AC
  Administered 2023-02-17: 1000 mg via ORAL
  Filled 2023-02-17: qty 2

## 2023-02-17 MED ORDER — CEFAZOLIN SODIUM-DEXTROSE 2-4 GM/100ML-% IV SOLN
2.0000 g | INTRAVENOUS | Status: AC
  Administered 2023-02-17: 2 g via INTRAVENOUS
  Filled 2023-02-17: qty 100

## 2023-02-17 MED ORDER — ALUM & MAG HYDROXIDE-SIMETH 200-200-20 MG/5ML PO SUSP: 30.0000 mL | ORAL | Status: DC | PRN

## 2023-02-17 MED ORDER — ASPIRIN 81 MG PO CHEW
81.0000 mg | CHEWABLE_TABLET | Freq: Two times a day (BID) | ORAL | Status: DC
  Administered 2023-02-17 – 2023-02-18 (×2): 81 mg via ORAL
  Filled 2023-02-17 (×2): qty 1

## 2023-02-17 MED ORDER — ACETAMINOPHEN 325 MG PO TABS: 325.0000 mg | ORAL_TABLET | Freq: Four times a day (QID) | ORAL | Status: DC | PRN

## 2023-02-17 MED ORDER — ONDANSETRON HCL 4 MG/2ML IJ SOLN
INTRAMUSCULAR | Status: AC
  Filled 2023-02-17: qty 2

## 2023-02-17 MED ORDER — FENTANYL CITRATE (PF) 100 MCG/2ML IJ SOLN
INTRAMUSCULAR | Status: AC
  Filled 2023-02-17: qty 2

## 2023-02-17 MED ORDER — PROPOFOL 1000 MG/100ML IV EMUL
INTRAVENOUS | Status: AC
  Filled 2023-02-17: qty 100

## 2023-02-17 MED ORDER — PHENYLEPHRINE HCL-NACL 20-0.9 MG/250ML-% IV SOLN
INTRAVENOUS | Status: DC | PRN
  Administered 2023-02-17: 10 ug/min via INTRAVENOUS

## 2023-02-17 MED ORDER — VITAMIN D 25 MCG (1000 UNIT) PO TABS
2000.0000 [IU] | ORAL_TABLET | Freq: Every day | ORAL | Status: DC
  Administered 2023-02-18: 2000 [IU] via ORAL
  Filled 2023-02-17: qty 2

## 2023-02-17 MED ORDER — FENTANYL CITRATE PF 50 MCG/ML IJ SOSY: 25.0000 ug | PREFILLED_SYRINGE | INTRAMUSCULAR | Status: DC | PRN

## 2023-02-17 MED ORDER — ATORVASTATIN CALCIUM 20 MG PO TABS
20.0000 mg | ORAL_TABLET | ORAL | Status: DC
  Administered 2023-02-17: 20 mg via ORAL
  Filled 2023-02-17: qty 1

## 2023-02-17 MED ORDER — LEVOTHYROXINE SODIUM 100 MCG PO TABS
100.0000 ug | ORAL_TABLET | Freq: Every day | ORAL | Status: DC
  Administered 2023-02-18: 100 ug via ORAL
  Filled 2023-02-17: qty 1

## 2023-02-17 MED ORDER — OXYCODONE HCL 5 MG PO TABS
5.0000 mg | ORAL_TABLET | ORAL | Status: DC | PRN
  Administered 2023-02-17 – 2023-02-18 (×4): 10 mg via ORAL
  Filled 2023-02-17 (×4): qty 2

## 2023-02-17 MED ORDER — ONDANSETRON HCL 4 MG PO TABS: 4.0000 mg | ORAL_TABLET | Freq: Four times a day (QID) | ORAL | Status: DC | PRN

## 2023-02-17 MED ORDER — ORAL CARE MOUTH RINSE: 15.0000 mL | Freq: Once | OROMUCOSAL | Status: AC

## 2023-02-17 MED ORDER — METHOCARBAMOL 500 MG PO TABS
500.0000 mg | ORAL_TABLET | Freq: Four times a day (QID) | ORAL | Status: DC | PRN
  Administered 2023-02-17: 500 mg via ORAL
  Filled 2023-02-17: qty 1

## 2023-02-17 MED ORDER — AMLODIPINE BESYLATE-VALSARTAN 5-160 MG PO TABS: 1.0000 | ORAL_TABLET | Freq: Every day | ORAL | Status: DC

## 2023-02-17 MED ORDER — ONDANSETRON HCL 4 MG/2ML IJ SOLN
INTRAMUSCULAR | Status: DC | PRN
  Administered 2023-02-17: 4 mg via INTRAVENOUS

## 2023-02-17 MED ORDER — LACTATED RINGERS IV SOLN: INTRAVENOUS | Status: DC

## 2023-02-17 MED ORDER — AMLODIPINE BESYLATE 5 MG PO TABS
5.0000 mg | ORAL_TABLET | Freq: Every day | ORAL | Status: DC
  Administered 2023-02-17 – 2023-02-18 (×2): 5 mg via ORAL
  Filled 2023-02-17 (×2): qty 1

## 2023-02-17 MED ORDER — DIPHENHYDRAMINE HCL 12.5 MG/5ML PO ELIX: 12.5000 mg | ORAL_SOLUTION | ORAL | Status: DC | PRN

## 2023-02-17 MED ORDER — DEXAMETHASONE SODIUM PHOSPHATE 10 MG/ML IJ SOLN
INTRAMUSCULAR | Status: AC
  Filled 2023-02-17: qty 1

## 2023-02-17 MED ORDER — CHLORHEXIDINE GLUCONATE 0.12 % MT SOLN
15.0000 mL | Freq: Once | OROMUCOSAL | Status: AC
  Administered 2023-02-17: 15 mL via OROMUCOSAL

## 2023-02-17 MED ORDER — PANTOPRAZOLE SODIUM 40 MG PO TBEC
40.0000 mg | DELAYED_RELEASE_TABLET | Freq: Every day | ORAL | Status: DC
  Administered 2023-02-17 – 2023-02-18 (×2): 40 mg via ORAL
  Filled 2023-02-17 (×2): qty 1

## 2023-02-17 MED ORDER — BUPIVACAINE IN DEXTROSE 0.75-8.25 % IT SOLN
INTRATHECAL | Status: DC | PRN
  Administered 2023-02-17: 1.6 mL via INTRATHECAL

## 2023-02-17 MED ORDER — DEXAMETHASONE SODIUM PHOSPHATE 10 MG/ML IJ SOLN
INTRAMUSCULAR | Status: DC | PRN
  Administered 2023-02-17: 8 mg via INTRAVENOUS

## 2023-02-17 MED ORDER — TRANEXAMIC ACID-NACL 1000-0.7 MG/100ML-% IV SOLN
INTRAVENOUS | Status: AC
  Filled 2023-02-17: qty 100

## 2023-02-17 MED ORDER — FENTANYL CITRATE (PF) 100 MCG/2ML IJ SOLN
INTRAMUSCULAR | Status: DC | PRN
  Administered 2023-02-17: 50 ug via INTRAVENOUS

## 2023-02-17 MED ORDER — 0.9 % SODIUM CHLORIDE (POUR BTL) OPTIME
TOPICAL | Status: DC | PRN
  Administered 2023-02-17: 1000 mL

## 2023-02-17 MED ORDER — POVIDONE-IODINE 10 % EX SWAB: 2.0000 | Freq: Once | CUTANEOUS | Status: DC

## 2023-02-17 MED ORDER — HYDROMORPHONE HCL 1 MG/ML IJ SOLN: 0.5000 mg | INTRAMUSCULAR | Status: DC | PRN

## 2023-02-17 MED ORDER — MENTHOL 3 MG MT LOZG: 1.0000 | LOZENGE | OROMUCOSAL | Status: DC | PRN

## 2023-02-17 MED ORDER — OXYCODONE HCL 5 MG PO TABS: 10.0000 mg | ORAL_TABLET | ORAL | Status: DC | PRN

## 2023-02-17 MED ORDER — DOCUSATE SODIUM 100 MG PO CAPS
100.0000 mg | ORAL_CAPSULE | Freq: Two times a day (BID) | ORAL | Status: DC
  Administered 2023-02-17 – 2023-02-18 (×2): 100 mg via ORAL
  Filled 2023-02-17 (×3): qty 1

## 2023-02-17 MED ORDER — MIDAZOLAM HCL 2 MG/2ML IJ SOLN
INTRAMUSCULAR | Status: AC
  Filled 2023-02-17: qty 2

## 2023-02-17 MED ORDER — METOCLOPRAMIDE HCL 5 MG PO TABS: 5.0000 mg | ORAL_TABLET | Freq: Three times a day (TID) | ORAL | Status: DC | PRN

## 2023-02-17 MED ORDER — SODIUM CHLORIDE 0.9 % IV SOLN: INTRAVENOUS | Status: DC

## 2023-02-17 MED ORDER — LIDOCAINE HCL (PF) 2 % IJ SOLN
INTRAMUSCULAR | Status: AC
  Filled 2023-02-17: qty 5

## 2023-02-17 MED ORDER — METOCLOPRAMIDE HCL 5 MG/ML IJ SOLN: 5.0000 mg | Freq: Three times a day (TID) | INTRAMUSCULAR | Status: DC | PRN

## 2023-02-17 MED ORDER — METHOCARBAMOL 500 MG IVPB - SIMPLE MED: 500.0000 mg | Freq: Four times a day (QID) | INTRAVENOUS | Status: DC | PRN

## 2023-02-17 MED ORDER — CEFAZOLIN SODIUM-DEXTROSE 1-4 GM/50ML-% IV SOLN
1.0000 g | Freq: Four times a day (QID) | INTRAVENOUS | Status: AC
  Administered 2023-02-17 (×2): 1 g via INTRAVENOUS
  Filled 2023-02-17 (×2): qty 50

## 2023-02-17 MED ORDER — SODIUM CHLORIDE 0.9 % IR SOLN
Status: DC | PRN
  Administered 2023-02-17: 1000 mL

## 2023-02-17 SURGICAL SUPPLY — 41 items
APL SKNCLS STERI-STRIP NONHPOA (GAUZE/BANDAGES/DRESSINGS)
BAG COUNTER SPONGE SURGICOUNT (BAG) ×1 IMPLANT
BAG SPEC THK2 15X12 ZIP CLS (MISCELLANEOUS) ×1
BAG SPNG CNTER NS LX DISP (BAG) ×1
BAG ZIPLOCK 12X15 (MISCELLANEOUS) IMPLANT
BENZOIN TINCTURE PRP APPL 2/3 (GAUZE/BANDAGES/DRESSINGS) IMPLANT
BLADE SAW SGTL 18X1.27X75 (BLADE) ×1 IMPLANT
COVER PERINEAL POST (MISCELLANEOUS) ×1 IMPLANT
COVER SURGICAL LIGHT HANDLE (MISCELLANEOUS) ×1 IMPLANT
CUP SECTOR GRIPTON 58MM (Orthopedic Implant) IMPLANT
DRAPE FOOT SWITCH (DRAPES) ×1 IMPLANT
DRAPE STERI IOBAN 125X83 (DRAPES) ×1 IMPLANT
DRAPE U-SHAPE 47X51 STRL (DRAPES) ×2 IMPLANT
DRSG AQUACEL AG ADV 3.5X10 (GAUZE/BANDAGES/DRESSINGS) ×1 IMPLANT
DURAPREP 26ML APPLICATOR (WOUND CARE) ×1 IMPLANT
ELECT REM PT RETURN 15FT ADLT (MISCELLANEOUS) ×1 IMPLANT
GAUZE XEROFORM 1X8 LF (GAUZE/BANDAGES/DRESSINGS) ×1 IMPLANT
GLOVE BIO SURGEON STRL SZ7.5 (GLOVE) ×1 IMPLANT
GLOVE BIOGEL PI IND STRL 8 (GLOVE) ×2 IMPLANT
GLOVE ECLIPSE 8.0 STRL XLNG CF (GLOVE) ×1 IMPLANT
GOWN STRL REUS W/ TWL XL LVL3 (GOWN DISPOSABLE) ×2 IMPLANT
GOWN STRL REUS W/TWL XL LVL3 (GOWN DISPOSABLE) ×2
HANDPIECE INTERPULSE COAX TIP (DISPOSABLE) ×1
HEAD CERAMIC 36 PLUS5 (Hips) IMPLANT
HOLDER FOLEY CATH W/STRAP (MISCELLANEOUS) ×1 IMPLANT
KIT TURNOVER KIT A (KITS) IMPLANT
LINER NEUTRAL 36X58 PLUS4 IMPLANT
PACK ANTERIOR HIP CUSTOM (KITS) ×1 IMPLANT
SET HNDPC FAN SPRY TIP SCT (DISPOSABLE) ×1 IMPLANT
STAPLER VISISTAT 35W (STAPLE) IMPLANT
STEM FEMORAL SZ 5MM STD ACTIS (Stem) IMPLANT
STRIP CLOSURE SKIN 1/2X4 (GAUZE/BANDAGES/DRESSINGS) IMPLANT
SUT ETHIBOND NAB CT1 #1 30IN (SUTURE) ×1 IMPLANT
SUT ETHILON 2 0 PS N (SUTURE) IMPLANT
SUT MNCRL AB 4-0 PS2 18 (SUTURE) IMPLANT
SUT VIC AB 0 CT1 36 (SUTURE) ×1 IMPLANT
SUT VIC AB 1 CT1 36 (SUTURE) ×1 IMPLANT
SUT VIC AB 2-0 CT1 27 (SUTURE) ×2
SUT VIC AB 2-0 CT1 TAPERPNT 27 (SUTURE) ×2 IMPLANT
TRAY FOLEY MTR SLVR 16FR STAT (SET/KITS/TRAYS/PACK) IMPLANT
YANKAUER SUCT BULB TIP NO VENT (SUCTIONS) ×1 IMPLANT

## 2023-02-17 NOTE — Op Note (Signed)
Operative Note  Date of operation: 02/17/2023 Preoperative diagnosis: Right hip primary osteoarthritis Postoperative diagnosis: Same  Procedure: Right direct anterior total hip replacement  Implants: Implant Name Type Inv. Item Serial No. Manufacturer Lot No. LRB No. Used Action  CUP SECTOR GRIPTON - [id number] Orthopedic Implant CUP SECTOR GRIPTON  DEPUY ORTH[employer] [id number] Right 1 Implanted  LINER NEUTRAL 36X58 PLUS4 - [id number]  LINER NEUTRAL 36X58 PLUS4  DEPUY ORTHOPAEDICS [id number] Right 1 Implanted  STEM FEMORAL SZ STD ACTIS - [id number] Stem STEM FEMORAL SZ STD ACTIS  DEPUY ORTHOPAEDICS [id number] Right 1 Implanted  HEAD CERAMIC 36 PLUS5 - [id number] Hips HEAD CERAMIC 36 PLUS5  DEPUY ORTHOPAEDICS [id number] Right 1 Implanted   Surgeon: Vanita Panda. Magnus Ivan, MD Assistant: Rexene Edison, PA-C  Anesthesia: Spinal Antibiotics: IV Ancef EBL: 200 cc Complications: None  Indications: The patient is a 77 year old athletic individual with unfortunately debilitating arthritis involving his right hip.  This is been well-documented with clinical exam and x-ray findings.  His hip pain has been getting worse and it is detrimentally affecting his mobility, his quality of life and his actives daily living to the point he does wish to proceed with a right total hip arthroplasty.  We agree with this as well.  We described the risk of acute blood loss anemia, nerve or vessel injury, fracture, infection, DVT, implant failure, wound healing issues and dislocation.  He understands there is also risk of leg length differences.  The goals are hopefully decrease pain, improve mobility, and improve quality of life.  Procedure description: After informed consent was obtained and the appropriate right hip was marked, the patient was brought to the operating room and sat up on the stretcher where spinal anesthesia was obtained.  He was then laid in supine position on stretcher and a Foley catheter  was placed.  Traction boots were placed on both his feet and he was placed supine on the Hana fracture table with a perineal placed in place in both legs and inline skeletal traction devices no traction applied.  Of note preoperatively he is definitely shorter on the right side than the left but his intraoperative x-rays make him look like he is longer on the right so we will compensate for that with the case.  His right operative hip was prepped and draped with DuraPrep and sterile drapes.  Timeout was called he identifies correct patient and correct right hip.  An incision was then made just inferior and posterior to the ASIS and carried slightly obliquely down the leg.  Dissection was carried down to the tensor fascia lata muscle and tensor fascia was then divided longitudinally to proceed with a direct interposed the hip.  Circumflex vessels were identified and cauterized.  The hip capsule was identified and opened up in L-type format finding a moderate joint effusion.  Cobra retractors were placed around the medial and lateral femoral neck and a femoral neck cut was made with an oscillating saw just proximal to the lesser trochanter and this was completed with an osteotome.  A corkscrew guide was placed in the femoral head and the femoral head was removed in its entirety and there was a wide area devoid of cartilage.  A bent Hohmann was then placed over the medial acetabular rim and remnants of the acetabular labrum and other debris removed.  Reaming was then initiated from a size 43 reamer and stepwise increments going up to a size 57 reamer with all reamers placed under direct visualization and  the last reamer was placed under direct fluoroscopy order to obtain the depth of reaming, the inclination and anteversion.  The real DePuy sector GRIPTION acetabular pendant size 58 was then placed without difficulty followed by 36+4 polythene liner.  Attention was then turned to the femur.  With the right leg  externally rotated to 120 degrees, extended and adducted, a Mueller retractor was placed medially and a Hohmann retractor was placed behind the greater trochanter.  A box cutting osteotome was used to enter the femoral canal.  Broaching was then initiated using the Actis broaching system from a size 0 broach going up to a size 5 broach and stepwise increments.  Based off his anatomy we then trialed a standard offset femoral neck and a 36+1.5 trial hip ball.  The leg was brought over and up and with traction and internal rotation reduced into the pelvis.  We assessed it radiographically and clinically and felt like we needed just a little bit more leg length.  We then dislocated the hip and remove the trial components.  We placed the real Actis femoral component from DePuy with standard offset size 5 and we went with the real 36+5 ceramic head ball.  Again this was reduced in the acetabulum and replaced a leg length, offset, range of motion and stability assessed both mechanically and radiographically.  We then irrigated the soft tissue with normal saline solution.  The joint capsule was closed with interrupted #1 upon suture.  The tensor fascia was closed with #1 Vicryl suture.  0 Vicryl was used to close the deep tissue and 2-0 Vicryl was used to close subcutaneous tissue.  The skin was closed with staples.  An Aquacel dressing was applied.  The patient was taken off on table taken recovery room in stable addition with all final counts being correct and no complications noted.  Clark PA-C did assist during the entire case and beginning in and his assistance was crucial medically necessary for soft tissue management and retraction, helping guide implant placement and a layered closure of the wound.

## 2023-02-17 NOTE — Transfer of Care (Signed)
Immediate Anesthesia Transfer of Care Note  Patient: Glenn Young  Procedure(s) Performed: RIGHT TOTAL HIP ARTHROPLASTY ANTERIOR APPROACH (Right: Hip)  Patient Location: PACU  Anesthesia Type:Spinal  Level of Consciousness: awake, alert , and oriented  Airway & Oxygen Therapy: Patient Spontanous Breathing and Patient connected to face mask oxygen  Post-op Assessment: Report given to RN  Post vital signs: Reviewed and stable  Last Vitals:  Vitals Value Taken Time  BP 111/83 02/17/23 1146  Temp 36.7 C 02/17/23 1145  Pulse 63 02/17/23 1147  Resp 18 02/17/23 1147  SpO2 99 % 02/17/23 1147  Vitals shown include unvalidated device data.  Last Pain:  Vitals:   02/17/23 0811  TempSrc:   PainSc: 0-No pain         Complications: No notable events documented.

## 2023-02-17 NOTE — Care Plan (Signed)
Ortho Bundle Case Management Note  Patient Details  Name: Glenn Young MRN: [id number] Date of Birth: 11-11-1945   [facility] call to patient to discuss his upcoming Right total hip arthroplasty with Dr. Magnus Ivan on 02/17/23. He is a [facility] Ortho bundle patient and is agreeable to case management. He lives with his spouse and she will be able to assist after discharge. He does have stairs to enter his home as well as a flight of stairs to his bedroom on the 2nd level. He will need a RW prior to discharge from the hospital. Anticipate HHPT will be needed after a short hospital stay. Referral made to [facility] after choice provided. Reviewed post op instructions. Will continue to follow for needs.                   DME Arranged:  Dan Humphreys rolling DME Agency:  Not yet ordered by Cyndia Skeeters RNCM   HH Arranged:  PT HH Agency:  [facility]  Additional Comments: Please contact me with any questions of if this plan should need to change.  Ralph Dowdy, RN, BSN, [employer]  [phone] 02/17/2023, 2:47 PM

## 2023-02-17 NOTE — Interval H&P Note (Signed)
History and Physical Interval Note: The patient understands that he is here today for right hip replacement to treat his severe right hip arthritis.  There is been no acute or interval change in medical status.  Please see H&P.  The risks and benefits of surgery have been discussed in detail and informed consent is obtained.  The right operative hip has been marked.  02/17/2023 8:36 AM  Glenn Young  has presented today for surgery, with the diagnosis of osteoarthritis right hip.  The various methods of treatment have been discussed with the patient and family. After consideration of risks, benefits and other options for treatment, the patient has consented to  Procedure(s): RIGHT TOTAL HIP ARTHROPLASTY ANTERIOR APPROACH (Right) as a surgical intervention.  The patient's history has been reviewed, patient examined, no change in status, stable for surgery.  I have reviewed the patient's chart and labs.  Questions were answered to the patient's satisfaction.     Kathryne Hitch

## 2023-02-17 NOTE — Anesthesia Postprocedure Evaluation (Signed)
Anesthesia Post Note  Patient: Glenn Young  Procedure(s) Performed: RIGHT TOTAL HIP ARTHROPLASTY ANTERIOR APPROACH (Right: Hip)     Patient location during evaluation: PACU Anesthesia Type: Spinal Level of consciousness: oriented and awake and alert Pain management: pain level controlled Vital Signs Assessment: post-procedure vital signs reviewed and stable Respiratory status: spontaneous breathing and respiratory function stable Cardiovascular status: blood pressure returned to baseline and stable Postop Assessment: no headache, no backache, no apparent nausea or vomiting, spinal receding and patient able to bend at knees Anesthetic complications: no  No notable events documented.  Last Vitals:  Vitals:   02/17/23 1330 02/17/23 1345  BP: 132/89 (!) 134/92  Pulse: (!) 58 64  Resp: 17 18  Temp:    SpO2: 98% 100%    Last Pain:  Vitals:   02/17/23 1215  TempSrc:   PainSc: 0-No pain                 Ralph Brouwer,W. EDMOND

## 2023-02-17 NOTE — Anesthesia Procedure Notes (Signed)
Spinal  Start time: 02/17/2023 10:16 AM End time: 02/17/2023 10:20 AM Reason for block: surgical anesthesia Staffing Performed: resident/CRNA  Resident/CRNA: Elisabeth Cara, CRNA Performed by: Elisabeth Cara, CRNA Authorized by: Gaynelle Adu, MD   Preanesthetic Checklist Completed: patient identified, IV checked, site marked, risks and benefits discussed, surgical consent, monitors and equipment checked, pre-op evaluation and timeout performed Spinal Block Patient position: sitting Prep: ChloraPrep and site prepped and draped Patient monitoring: heart rate, cardiac monitor, continuous pulse ox and blood pressure Approach: midline Location: L3-4 Injection technique: single-shot Needle Needle type: Pencan  Needle gauge: 24 G Needle length: 10 cm Assessment Sensory level: T4 Events: CSF return Additional Notes Pt placed in sitting position for spinal placement. Spinal kit expiration date checked and verified. Sterile prep and drape of back. Skin anesthetized with local. One attempt by CRNA. + clear free flowing CSF. - heme. Pt tolerated the procedure well.

## 2023-02-17 NOTE — TOC Transition Note (Signed)
Transition of Care [facility]) - CM/SW Discharge Note   Patient Details  Name: Glenn Young MRN: [id number] Date of Birth: 26-Jul-1946  Transition of Care [facility]) CM/SW Contact:  Amada Jupiter, LCSW Phone Number: 02/17/2023, 3:02 PM   Clinical Narrative:     Met with pt and spouse and confirming need for RW and no DME agency preference - order placed with [employer] for delivery to room.  HHPT prearranged with [facility]. No further TOC needs.  Final next level of care: Home w Home Health Services Barriers to Discharge: No Barriers Identified   Patient Goals and CMS Choice      Discharge Placement                         Discharge Plan and Services Additional resources added to the After Visit Summary for                  DME Arranged: Walker rolling DME Agency: [employer] Date DME Agency Contacted: 02/17/23 Time DME Agency Contacted: 1502 Representative spoke with at DME Agency: Vaughan Basta HH Arranged: PT HH Agency: [facility]        Social Determinants of Health (SDOH) Interventions SDOH Screenings   Tobacco Use: Medium Risk (02/17/2023)     Readmission Risk Interventions     No data to display

## 2023-02-17 NOTE — Evaluation (Signed)
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  Pt should progress to dc home with family assist.     Recommendations for follow up therapy are one component of a multi-disciplinary discharge planning process, led by the attending physician.  Recommendations may be updated based on patient status, additional functional criteria and insurance authorization.  Follow Up Recommendations       Assistance Recommended at Discharge Intermittent Supervision/Assistance  Patient can return home with the following       Equipment Recommendations Rolling walker (2 wheels)  Recommendations for Other Services       Functional Status Assessment Patient has had a recent decline in their functional status and demonstrates the ability to make significant improvements in function in a reasonable and predictable amount of time.     Precautions / Restrictions Precautions Precautions: Fall Restrictions Weight Bearing Restrictions: No Other Position/Activity Restrictions: WBAT      Mobility  Bed Mobility Overal bed mobility: Needs Assistance Bed Mobility: Supine to Sit, Sit to Supine     Supine to sit: Mod assist Sit to supine: Mod assist   General bed mobility comments: Increased time with cues for sequence and use of UEs to self assist    Transfers Overall transfer level: Needs assistance Equipment used: Rolling walker (2 wheels) Transfers: Sit to/from Stand Sit to Stand: Min assist, Mod assist, +2 safety/equipment, From elevated surface           General transfer comment: cues for LE management and use of UEs to self assist    Ambulation/Gait Ambulation/Gait assistance: Min assist, +2 safety/equipment,  +2 physical assistance Gait Distance (Feet): 7 Feet Assistive device: Rolling walker (2 wheels) Gait Pattern/deviations: Step-to pattern, Decreased step length - right, Decreased step length - left, Shuffle, Trunk flexed Gait velocity: decr     General Gait Details: cues for sequence, posture and position from RW; short shuffling step with pt reporting residual numbness for spinal still present  Stairs            Wheelchair Mobility    Modified Rankin (Stroke Patients Only)       Balance Overall balance assessment: Needs assistance Sitting-balance support: No upper extremity supported, Feet supported Sitting balance-Leahy Scale: Fair     Standing balance support: Bilateral upper extremity supported Standing balance-Leahy Scale: Poor                               Pertinent Vitals/Pain Pain Assessment Pain Assessment: 0-10 Pain Score: 3  Pain Location: R hip/thigh Pain Descriptors / Indicators: Aching, Sore, Grimacing    Home Living Family/patient expects to be discharged to:: Private residence Living Arrangements: Spouse/significant other Available Help at Discharge: Family;Available 24 hours/day Type of Home: House Home Access: Stairs to enter Entrance Stairs-Rails: Doctor, general practice of Steps: 12   Home Layout: Two level Home Equipment: Cane - single point      Prior Function Prior Level of Function : Independent/Modified Independent                     Hand Dominance        Extremity/Trunk Assessment   Upper Extremity Assessment Upper  Extremity Assessment: Overall WFL for tasks assessed    Lower Extremity Assessment Lower Extremity Assessment: RLE deficits/detail    Cervical / Trunk Assessment Cervical / Trunk Assessment: Normal  Communication   Communication: No difficulties  Cognition Arousal/Alertness: Awake/alert Behavior During Therapy: WFL for tasks assessed/performed Overall Cognitive Status:  Within Functional Limits for tasks assessed                                          General Comments      Exercises Total Joint Exercises Ankle Circles/Pumps: AROM, Both, 15 reps, Supine   Assessment/Plan    PT Assessment Patient needs continued PT services  PT Problem List Decreased strength;Decreased range of motion;Decreased activity tolerance;Decreased balance;Decreased mobility;Decreased knowledge of use of DME;Pain       PT Treatment Interventions DME instruction;Gait training;Stair training;Functional mobility training;Therapeutic activities;Therapeutic exercise;Patient/family education    PT Goals (Current goals can be found in the Care Plan section)  Acute Rehab PT Goals Patient Stated Goal: Regain IND PT Goal Formulation: With patient Time For Goal Achievement: 02/24/23 Potential to Achieve Goals: Good    Frequency 7X/week     Co-evaluation               AM-PAC PT "6 Clicks" Mobility  Outcome Measure Help needed turning from your back to your side while in a flat bed without using bedrails?: A Little Help needed moving from lying on your back to sitting on the side of a flat bed without using bedrails?: A Lot Help needed moving to and from a bed to a chair (including a wheelchair)?: A Lot Help needed standing up from a chair using your arms (e.g., wheelchair or bedside chair)?: A Lot Help needed to walk in hospital room?: Total Help needed climbing 3-5 steps with a railing? : Total 6 Click Score: 11    End of Session Equipment Utilized During Treatment: Gait belt Activity Tolerance: Patient tolerated treatment well Patient left: in bed;with call bell/phone within reach;with family/visitor present Nurse Communication: Mobility status PT Visit Diagnosis: Unsteadiness on feet (R26.81);Difficulty in walking, not elsewhere classified (R26.2)    Time: 6962-9528 PT Time Calculation (min) (ACUTE ONLY): 32 min   Charges:   PT  Evaluation $PT Eval Low Complexity: 1 Low PT Treatments $Therapeutic Activity: 8-22 mins        Mauro Kaufmann PT Acute Rehabilitation Services Pager [phone] Office [phone]   Alec Mcphee 02/17/2023, 6:01 PM

## 2023-02-17 NOTE — Anesthesia Procedure Notes (Addendum)
Procedure Name: MAC Date/Time: 02/17/2023 10:15 AM  Performed by: Elisabeth Cara, CRNAPre-anesthesia Checklist: Patient identified, Emergency Drugs available, Suction available, Patient being monitored and Timeout performed Patient Re-evaluated:Patient Re-evaluated prior to induction Oxygen Delivery Method: Simple face mask Placement Confirmation: positive ETCO2 Dental Injury: Teeth and Oropharynx as per pre-operative assessment

## 2023-02-18 DIAGNOSIS — Z79899 Other long term (current) drug therapy: Secondary | ICD-10-CM | POA: Diagnosis not present

## 2023-02-18 DIAGNOSIS — M1611 Unilateral primary osteoarthritis, right hip: Secondary | ICD-10-CM | POA: Diagnosis not present

## 2023-02-18 DIAGNOSIS — I10 Essential (primary) hypertension: Secondary | ICD-10-CM | POA: Diagnosis not present

## 2023-02-18 DIAGNOSIS — E039 Hypothyroidism, unspecified: Secondary | ICD-10-CM | POA: Diagnosis not present

## 2023-02-18 DIAGNOSIS — Z87891 Personal history of nicotine dependence: Secondary | ICD-10-CM | POA: Diagnosis not present

## 2023-02-18 LAB — BASIC METABOLIC PANEL
Anion gap: 9 (ref 5–15)
BUN: 25 mg/dL — ABNORMAL HIGH (ref 8–23)
CO2: 19 mmol/L — ABNORMAL LOW (ref 22–32)
Calcium: 7.9 mg/dL — ABNORMAL LOW (ref 8.9–10.3)
Chloride: 104 mmol/L (ref 98–111)
Creatinine, Ser: 1.23 mg/dL (ref 0.61–1.24)
GFR, Estimated: >60 mL/min (ref >60)
Glucose, Bld: 183 mg/dL — ABNORMAL HIGH (ref 70–99)
Potassium: 4.1 mmol/L (ref 3.5–5.1)
Sodium: 132 mmol/L — ABNORMAL LOW (ref 135–145)

## 2023-02-18 LAB — CBC
HCT: 40 % (ref 39.0–52.0)
Hemoglobin: 13.4 g/dL (ref 13.0–17.0)
MCH: 29.8 pg (ref 26.0–34.0)
MCHC: 33.5 g/dL (ref 30.0–36.0)
MCV: 89.1 fL (ref 80.0–100.0)
Platelets: 229 10*3/uL (ref 150–400)
RBC: 4.49 MIL/uL (ref 4.22–5.81)
RDW: 12.6 % (ref 11.5–15.5)
WBC: 15.5 10*3/uL — ABNORMAL HIGH (ref 4.0–10.5)
nRBC: 0 % (ref 0.0–0.2)

## 2023-02-18 MED ORDER — OXYCODONE HCL 5 MG PO TABS: 5.0000 mg | ORAL_TABLET | Freq: Four times a day (QID) | ORAL | 0 refills | Status: DC | PRN

## 2023-02-18 MED ORDER — ASPIRIN 81 MG PO CHEW: 81.0000 mg | CHEWABLE_TABLET | Freq: Two times a day (BID) | ORAL | 0 refills | Status: DC

## 2023-02-18 MED ORDER — METHOCARBAMOL 500 MG PO TABS: 500.0000 mg | ORAL_TABLET | Freq: Four times a day (QID) | ORAL | 1 refills | Status: DC | PRN

## 2023-02-18 NOTE — TOC Transition Note (Addendum)
Transition of Care [facility]) - CM/SW Discharge Note   Patient Details  Name: Glenn Young MRN: [id number] Date of Birth: 1946/02/17  Transition of Care [facility]) CM/SW Contact:  Otelia Santee, LCSW Phone Number: 02/18/2023, 3:11 PM   Clinical Narrative:    [employer] for RW and 3n1 delivery. Per Vaughan Basta both will be delivered to pt today.   Update 4:40pm- notified by RN DME has been delivered. Pt upset and requesting DME be delivered to home. Spoke with Jermaine at [employer] who will have DME delivered to pt's home.    Final next level of care: Home w Home Health Services Barriers to Discharge: No Barriers Identified   Patient Goals and CMS Choice      Discharge Placement                         Discharge Plan and Services Additional resources added to the After Visit Summary for                  DME Arranged: Walker rolling DME Agency: [employer] Date DME Agency Contacted: 02/17/23 Time DME Agency Contacted: 1502 Representative spoke with at DME Agency: Vaughan Basta HH Arranged: PT HH Agency: [facility]        Social Determinants of Health (SDOH) Interventions SDOH Screenings   Food Insecurity: No Food Insecurity (02/17/2023)  Housing: Low Risk  (02/17/2023)  Transportation Needs: No Transportation Needs (02/17/2023)  Utilities: Not At Risk (02/17/2023)  Tobacco Use: Medium Risk (02/17/2023)     Readmission Risk Interventions     No data to display

## 2023-02-18 NOTE — Progress Notes (Signed)
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 Demonstrated and instructed how to use belt to self assist LE off bed.  Assisted with amb in hallway.  Then returned to room to perform some TE's following HEP handout.  Instructed on proper tech, freq as well as use of ICE.   Pt will need another PT session to complete HEP and practice stairs.   Recommendations for follow up therapy are one component of a multi-disciplinary discharge planning process, led by the attending physician.  Recommendations may be updated based on patient status, additional functional criteria and insurance authorization.  Follow Up Recommendations       Assistance Recommended at Discharge Intermittent Supervision/Assistance  Patient can return home with the following A little help with walking and/or transfers;A little help with bathing/dressing/bathroom;Assist for transportation   Equipment Recommendations  Rolling walker (2 wheels)    Recommendations for Other Services       Precautions / Restrictions Precautions Precautions: Fall Restrictions Weight Bearing Restrictions: No Other Position/Activity Restrictions: WBAT     Mobility  Bed Mobility Overal bed mobility: Needs Assistance Bed Mobility: Supine to Sit     Supine to sit: Min assist, Min guard     General bed mobility comments: demonstarted and instructed on use of ICE    Transfers Overall transfer level: Needs assistance Equipment used: Rolling walker (2 wheels) Transfers: Sit to/from Stand Sit to Stand: Min guard, Supervision           General transfer comment: cues for LE management and use of UEs to self assist    Ambulation/Gait Ambulation/Gait assistance: Supervision, Min guard Gait Distance (Feet): 45  Feet Assistive device: Rolling walker (2 wheels) Gait Pattern/deviations: Step-to pattern, Decreased step length - right, Decreased step length - left, Shuffle, Trunk flexed Gait velocity: decr     General Gait Details: cues for sequence, posture and position from RW; short shuffling step with pt reporting residual numbness for spinal still present   Stairs             Wheelchair Mobility    Modified Rankin (Stroke Patients Only)       Balance                                            Cognition Arousal/Alertness: Awake/alert Behavior During Therapy: WFL for tasks assessed/performed Overall Cognitive Status: Within Functional Limits for tasks assessed                                 General Comments: AxO X 3 very pleasant        Exercises      General Comments        Pertinent Vitals/Pain Pain Assessment Pain Assessment: 0-10 Pain Score: 4  Pain Location: R hip/thigh Pain Descriptors / Indicators: Aching, Sore, Grimacing, Operative site guarding Pain Intervention(s): Monitored during session, Premedicated before session, Repositioned, Ice applied    Home Living  Prior Function            PT Goals (current goals can now be found in the care plan section) Progress towards PT goals: Progressing toward goals    Frequency    7X/week      PT Plan Current plan remains appropriate    Co-evaluation              AM-PAC PT "6 Clicks" Mobility   Outcome Measure                   End of Session Equipment Utilized During Treatment: Gait belt Activity Tolerance: Patient tolerated treatment well Patient left: in chair;with call bell/phone within reach Nurse Communication: Mobility status PT Visit Diagnosis: Unsteadiness on feet (R26.81);Difficulty in walking, not elsewhere classified (R26.2)     Time: 2956-2130 PT Time Calculation (min) (ACUTE ONLY): 28  min  Charges:  $Gait Training: 8-22 mins $Therapeutic Exercise: 8-22 mins                     Felecia Shelling  PTA Acute  Rehabilitation Services Office M-F          [phone]

## 2023-02-18 NOTE — Progress Notes (Signed)
Physical Therapy Treatment Patient Details Name: Glenn Young MRN: [id number] DOB: 1946-10-06 Today's Date: 02/18/2023   History of Present Illness Pt s/p R THR and with hx of dilation of aorta    PT Comments    POD # 1 pm session Pt OOB in recliner.  Spouse present.  Assisted with amb in hallway, practiced stairs Then returned to room to perform some TE's following HEP handout.  Instructed on proper tech, freq as well as use of ICE.   Addressed all mobility questions, discussed appropriate activity, educated on use of ICE.  Pt ready for D/C to home.   Recommendations for follow up therapy are one component of a multi-disciplinary discharge planning process, led by the attending physician.  Recommendations may be updated based on patient status, additional functional criteria and insurance authorization.  Follow Up Recommendations       Assistance Recommended at Discharge Intermittent Supervision/Assistance  Patient can return home with the following A little help with walking and/or transfers;A little help with bathing/dressing/bathroom;Assist for transportation   Equipment Recommendations  Rolling walker (2 wheels)    Recommendations for Other Services       Precautions / Restrictions Precautions Precautions: Fall Restrictions Weight Bearing Restrictions: No Other Position/Activity Restrictions: WBAT     Mobility  Bed Mobility Overal bed mobility: Needs Assistance Bed Mobility: Supine to Sit     Supine to sit: Min assist, Min guard     General bed mobility comments: OOB in recliner    Transfers Overall transfer level: Needs assistance Equipment used: Rolling walker (2 wheels) Transfers: Sit to/from Stand Sit to Stand: Supervision           General transfer comment: cues for LE management and use of UEs to self assist    Ambulation/Gait Ambulation/Gait assistance: Supervision Gait Distance (Feet): 55 Feet Assistive device: Rolling walker (2  wheels) Gait Pattern/deviations: Step-to pattern, Decreased step length - right, Decreased step length - left, Shuffle, Trunk flexed Gait velocity: decr     General Gait Details: cues for sequence, posture and position from RW; short shuffling step with pt reporting residual numbness for spinal still present   Stairs Stairs: Yes Stairs assistance: Supervision Stair Management: Two rails, Step to pattern, Forwards Number of Stairs: 4 General stair comments: VC's on proper sequencing as well as safety.  Pt stated he will have 2 walkers at home, "one for upstairs and one for downstairs".  Spouse present during activity.   Wheelchair Mobility    Modified Rankin (Stroke Patients Only)       Balance                                            Cognition Arousal/Alertness: Awake/alert Behavior During Therapy: WFL for tasks assessed/performed Overall Cognitive Status: Within Functional Limits for tasks assessed                                 General Comments: AxO X 3 very pleasant        Exercises  Total Hip Replacement TE's following HEP Handout 10 reps ankle pumps 05 reps knee presses 05 reps heel slides 05 reps SAQ's 05 reps ABD Instructed how to use a belt loop to assist  Followed by ICE     General Comments  Pertinent Vitals/Pain Pain Assessment Pain Assessment: 0-10 Pain Score: 4  Pain Location: R hip/thigh Pain Descriptors / Indicators: Aching, Sore, Grimacing, Operative site guarding Pain Intervention(s): Monitored during session, Premedicated before session, Repositioned, Ice applied    Home Living                          Prior Function            PT Goals (current goals can now be found in the care plan section) Progress towards PT goals: Progressing toward goals    Frequency    7X/week      PT Plan Current plan remains appropriate    Co-evaluation              AM-PAC PT "6  Clicks" Mobility   Outcome Measure  Help needed turning from your back to your side while in a flat bed without using bedrails?: A Little Help needed moving from lying on your back to sitting on the side of a flat bed without using bedrails?: A Little Help needed moving to and from a bed to a chair (including a wheelchair)?: A Little Help needed standing up from a chair using your arms (e.g., wheelchair or bedside chair)?: A Little Help needed to walk in hospital room?: A Little Help needed climbing 3-5 steps with a railing? : A Little 6 Click Score: 18    End of Session Equipment Utilized During Treatment: Gait belt Activity Tolerance: Patient tolerated treatment well Patient left: in chair;with call bell/phone within reach Nurse Communication: Mobility status PT Visit Diagnosis: Unsteadiness on feet (R26.81);Difficulty in walking, not elsewhere classified (R26.2)     Time: 9562-1308 PT Time Calculation (min) (ACUTE ONLY): 25 min  Charges:  $Gait Training: 8-22 mins $Therapeutic Activity: 8-22 mins                     Felecia Shelling  PTA Acute  Rehabilitation Services Office M-F          [phone]

## 2023-02-18 NOTE — Progress Notes (Signed)
Pt waited for several hours for his walker. He became very irritated. He asked if it could be delivered to his house. TOC said yes.  Pt asked that the delivery be changed to home.  He insisted on leaving without a walker.  He was offered the walker in his room but declined.

## 2023-02-18 NOTE — Discharge Summary (Signed)
Patient ID: CAVALLI PACIS MRN: [id number] DOB/AGE: 1945/11/26 77 y.o.  Admit date: 02/17/2023 Discharge date: 02/18/2023  Admission Diagnoses:  Principal Problem:   Unilateral primary osteoarthritis, right hip Active Problems:   Status post total replacement of right hip   Discharge Diagnoses:  Same  Past Medical History:  Diagnosis Date   Ascending aorta enlargement (HCC)    Dilation of aorta (HCC)    Diverticulosis    Elevated creatine kinase level    Elevated white blood cell count    Family history of clotting disorder    GERD (gastroesophageal reflux disease)    Gout    Hyperlipidemia    Hypertension    Hypothyroidism    Paralysis (HCC)    pt deneis   Sinusitis    Vitamin D deficiency     Surgeries: Procedure(s): RIGHT TOTAL HIP ARTHROPLASTY ANTERIOR APPROACH on 02/17/2023   Consultants:   Discharged Condition: Improved  Hospital Course: Glenn Young is an 77 y.o. male who was admitted 02/17/2023 for operative treatment ofUnilateral primary osteoarthritis, right hip. Patient has severe unremitting pain that affects sleep, daily activities, and work/hobbies. After pre-op clearance the patient was taken to the operating room on 02/17/2023 and underwent  Procedure(s): RIGHT TOTAL HIP ARTHROPLASTY ANTERIOR APPROACH.    Patient was given perioperative antibiotics:  Anti-infectives (From admission, onward)    Start     Dose/Rate Route Frequency Ordered Stop   02/17/23 1600  ceFAZolin (ANCEF) IVPB 1 g/50 mL premix        1 g 100 mL/hr over 30 Minutes Intravenous Every 6 hours 02/17/23 1434 02/17/23 2117   02/17/23 0745  ceFAZolin (ANCEF) IVPB 2g/100 mL premix        2 g 200 mL/hr over 30 Minutes Intravenous On call to O.R. 02/17/23 0740 02/17/23 1051        Patient was given sequential compression devices, early ambulation, and chemoprophylaxis to prevent DVT.  Patient benefited maximally from hospital stay and there were no complications.    Recent vital signs:  Patient Vitals for the past 24 hrs:  BP Temp Temp src Pulse Resp SpO2  02/18/23 1425 (!) 157/66 98 F (36.7 C) Oral (!) 56 15 100 %  02/18/23 1415 (!) 132/97 97.8 F (36.6 C) Oral 71 16 96 %  02/18/23 0946 131/77 97.8 F (36.6 C) Oral 65 14 98 %  02/18/23 0549 114/78 97.9 F (36.6 C) Oral 62 19 93 %  02/18/23 0141 113/76 97.8 F (36.6 C) Oral 66 16 93 %  02/17/23 2059 (!) 127/96 98 F (36.7 C) Oral 77 20 94 %     Recent laboratory studies:  Recent Labs    02/18/23 0345  WBC 15.5*  HGB 13.4  HCT 40.0  PLT 229  NA 132*  K 4.1  CL 104  CO2 19*  BUN 25*  CREATININE 1.23  GLUCOSE 183*  CALCIUM 7.9*     Discharge Medications:   Allergies as of 02/18/2023   No Known Allergies      Medication List     TAKE these medications    amLODipine-valsartan 5-160 MG tablet Commonly known as: EXFORGE Take 1 tablet by mouth daily.   aspirin 81 MG chewable tablet Chew 1 tablet (81 mg total) by mouth 2 (two) times daily.   atorvastatin 20 MG tablet Commonly known as: LIPITOR Take 20 mg by mouth 3 (three) times a week. TAKE WITH PROBIOTIC   CeleBREX 100 MG capsule Generic drug: celecoxib Take 100 mg  by mouth daily.   Colchicine 0.6 MG Caps Take 0.6 mg by mouth daily as needed (gout attack).   levothyroxine 100 MCG tablet Commonly known as: SYNTHROID Take 100 mcg by mouth daily before breakfast.   methocarbamol 500 MG tablet Commonly known as: ROBAXIN Take 1 tablet (500 mg total) by mouth every 6 (six) hours as needed for muscle spasms.   oxyCODONE 5 MG immediate release tablet Commonly known as: Oxy IR/ROXICODONE Take 1-2 tablets (5-10 mg total) by mouth every 6 (six) hours as needed for moderate pain (pain score 4-6).   PROBIOTIC DAILY PO Take 1 capsule by mouth every other day.   Vitamin D3 50 MCG (2000 UT) Tabs Take 2,000 Units by mouth daily.               Durable Medical Equipment  (From admission, onward)           Start     Ordered    02/17/23 1435  DME 3 n 1  Once        02/17/23 1434   02/17/23 1435  DME Walker rolling  Once       Question Answer Comment  Walker: With 5 Inch Wheels   Patient needs a walker to treat with the following condition Status post total replacement of right hip      02/17/23 1434            Diagnostic Studies: DG Pelvis Portable  Result Date: 02/17/2023 CLINICAL DATA:  Postop right hip replacement. EXAM: PORTABLE PELVIS 1-2 VIEWS COMPARISON:  None Available. FINDINGS: Right hip arthroplasty in expected alignment. No periprosthetic lucency or fracture. Recent postsurgical change includes air and edema in the soft tissues. Lateral skin staples in place. IMPRESSION: Right hip arthroplasty without immediate postoperative complication. Electronically Signed   By: Narda Rutherford M.D.   On: 02/17/2023 12:20   DG HIP UNILAT WITH PELVIS 1V RIGHT  Result Date: 02/17/2023 CLINICAL DATA:  Elective surgery. EXAM: DG HIP (WITH OR WITHOUT PELVIS) 1V RIGHT COMPARISON:  None Available. FINDINGS: Four fluoroscopic spot views of the pelvis and right hip obtained in the operating room during right hip arthroplasty. Fluoroscopy time 15 seconds. Dose 1.3575 mGy. IMPRESSION: Intraoperative fluoroscopy during right hip arthroplasty. Electronically Signed   By: Narda Rutherford M.D.   On: 02/17/2023 12:20   DG C-Arm 1-60 Min-No Report  Result Date: 02/17/2023 Fluoroscopy was utilized by the requesting physician.  No radiographic interpretation.    Disposition: Discharge disposition: 01-Home or Self Care          Follow-up Information     Kathryne Hitch, MD. Go on 03/02/2023.   Specialty: Orthopedic Surgery Why: at 1:30 pm for your first post operative appointment with Dr. Kendrick Ranch information: [address] [place] [address] [phone]                  Signed: Kathryne Hitch 02/18/2023, 7:19 PM

## 2023-02-18 NOTE — Discharge Instructions (Signed)

## 2023-02-18 NOTE — Progress Notes (Signed)
Subjective: 1 Day Post-Op Procedure(s) (LRB): RIGHT TOTAL HIP ARTHROPLASTY ANTERIOR APPROACH (Right) Patient reports pain as moderate.    Objective: Vital signs in last 24 hours: Temp:  [97.8 F (36.6 C)-98 F (36.7 C)] 97.8 F (36.6 C) (05/04 0946) Pulse Rate:  [57-80] 65 (05/04 0946) Resp:  [12-20] 14 (05/04 0946) BP: (111-137)/(76-101) 131/77 (05/04 0946) SpO2:  [93 %-100 %] 98 % (05/04 0946)  Intake/Output from previous day: 05/03 0701 - 05/04 0700 In: 2465 [P.O.:580; I.V.:1885] Out: 1525 [Urine:1325; Blood:200] Intake/Output this shift: Total I/O In: 240 [P.O.:240] Out: -   Recent Labs    02/18/23 0345  HGB 13.4   Recent Labs    02/18/23 0345  WBC 15.5*  RBC 4.49  HCT 40.0  PLT 229   Recent Labs    02/18/23 0345  NA 132*  K 4.1  CL 104  CO2 19*  BUN 25*  CREATININE 1.23  GLUCOSE 183*  CALCIUM 7.9*   No results for input(s): "LABPT", "INR" in the last 72 hours.  Sensation intact distally Intact pulses distally Dorsiflexion/Plantar flexion intact Incision: dressing C/D/I   Assessment/Plan: 1 Day Post-Op Procedure(s) (LRB): RIGHT TOTAL HIP ARTHROPLASTY ANTERIOR APPROACH (Right) Up with therapy Discharge home with home health this afternoon.      Kathryne Hitch 02/18/2023, 10:26 AM

## 2023-02-19 DIAGNOSIS — Z471 Aftercare following joint replacement surgery: Secondary | ICD-10-CM | POA: Diagnosis not present

## 2023-02-19 DIAGNOSIS — M109 Gout, unspecified: Secondary | ICD-10-CM | POA: Diagnosis not present

## 2023-02-19 DIAGNOSIS — Z7982 Long term (current) use of aspirin: Secondary | ICD-10-CM | POA: Diagnosis not present

## 2023-02-19 DIAGNOSIS — M1990 Unspecified osteoarthritis, unspecified site: Secondary | ICD-10-CM | POA: Diagnosis not present

## 2023-02-19 DIAGNOSIS — Z96641 Presence of right artificial hip joint: Secondary | ICD-10-CM | POA: Diagnosis not present

## 2023-02-19 DIAGNOSIS — E039 Hypothyroidism, unspecified: Secondary | ICD-10-CM | POA: Diagnosis not present

## 2023-02-19 DIAGNOSIS — I77810 Thoracic aortic ectasia: Secondary | ICD-10-CM | POA: Diagnosis not present

## 2023-02-19 DIAGNOSIS — E785 Hyperlipidemia, unspecified: Secondary | ICD-10-CM | POA: Diagnosis not present

## 2023-02-19 DIAGNOSIS — K219 Gastro-esophageal reflux disease without esophagitis: Secondary | ICD-10-CM | POA: Diagnosis not present

## 2023-02-19 DIAGNOSIS — E559 Vitamin D deficiency, unspecified: Secondary | ICD-10-CM | POA: Diagnosis not present

## 2023-02-19 DIAGNOSIS — I10 Essential (primary) hypertension: Secondary | ICD-10-CM | POA: Diagnosis not present

## 2023-02-19 DIAGNOSIS — Z87891 Personal history of nicotine dependence: Secondary | ICD-10-CM | POA: Diagnosis not present

## 2023-02-19 DIAGNOSIS — K5790 Diverticulosis of intestine, part unspecified, without perforation or abscess without bleeding: Secondary | ICD-10-CM | POA: Diagnosis not present

## 2023-02-20 ENCOUNTER — Encounter (HOSPITAL_COMMUNITY): Payer: Self-pay | Admitting: Orthopaedic Surgery

## 2023-02-21 DIAGNOSIS — M109 Gout, unspecified: Secondary | ICD-10-CM | POA: Diagnosis not present

## 2023-02-21 DIAGNOSIS — K5790 Diverticulosis of intestine, part unspecified, without perforation or abscess without bleeding: Secondary | ICD-10-CM | POA: Diagnosis not present

## 2023-02-21 DIAGNOSIS — I10 Essential (primary) hypertension: Secondary | ICD-10-CM | POA: Diagnosis not present

## 2023-02-21 DIAGNOSIS — Z471 Aftercare following joint replacement surgery: Secondary | ICD-10-CM | POA: Diagnosis not present

## 2023-02-21 DIAGNOSIS — M1990 Unspecified osteoarthritis, unspecified site: Secondary | ICD-10-CM | POA: Diagnosis not present

## 2023-02-21 DIAGNOSIS — I77810 Thoracic aortic ectasia: Secondary | ICD-10-CM | POA: Diagnosis not present

## 2023-02-22 ENCOUNTER — Telehealth: Payer: Self-pay | Admitting: *Deleted

## 2023-02-22 NOTE — Telephone Encounter (Signed)
Ortho bundle D/C call completed 02/20/23.

## 2023-02-24 DIAGNOSIS — Z471 Aftercare following joint replacement surgery: Secondary | ICD-10-CM | POA: Diagnosis not present

## 2023-02-24 DIAGNOSIS — M1990 Unspecified osteoarthritis, unspecified site: Secondary | ICD-10-CM | POA: Diagnosis not present

## 2023-02-24 DIAGNOSIS — I10 Essential (primary) hypertension: Secondary | ICD-10-CM | POA: Diagnosis not present

## 2023-02-24 DIAGNOSIS — I77810 Thoracic aortic ectasia: Secondary | ICD-10-CM | POA: Diagnosis not present

## 2023-02-24 DIAGNOSIS — M109 Gout, unspecified: Secondary | ICD-10-CM | POA: Diagnosis not present

## 2023-02-24 DIAGNOSIS — K5790 Diverticulosis of intestine, part unspecified, without perforation or abscess without bleeding: Secondary | ICD-10-CM | POA: Diagnosis not present

## 2023-02-28 DIAGNOSIS — M109 Gout, unspecified: Secondary | ICD-10-CM | POA: Diagnosis not present

## 2023-02-28 DIAGNOSIS — K5790 Diverticulosis of intestine, part unspecified, without perforation or abscess without bleeding: Secondary | ICD-10-CM | POA: Diagnosis not present

## 2023-02-28 DIAGNOSIS — I77810 Thoracic aortic ectasia: Secondary | ICD-10-CM | POA: Diagnosis not present

## 2023-02-28 DIAGNOSIS — Z471 Aftercare following joint replacement surgery: Secondary | ICD-10-CM | POA: Diagnosis not present

## 2023-02-28 DIAGNOSIS — M1990 Unspecified osteoarthritis, unspecified site: Secondary | ICD-10-CM | POA: Diagnosis not present

## 2023-02-28 DIAGNOSIS — I10 Essential (primary) hypertension: Secondary | ICD-10-CM | POA: Diagnosis not present

## 2023-03-02 ENCOUNTER — Telehealth: Payer: Self-pay | Admitting: *Deleted

## 2023-03-02 ENCOUNTER — Ambulatory Visit (INDEPENDENT_AMBULATORY_CARE_PROVIDER_SITE_OTHER): Payer: Medicare Other | Admitting: Orthopaedic Surgery

## 2023-03-02 ENCOUNTER — Encounter: Payer: Self-pay | Admitting: Orthopaedic Surgery

## 2023-03-02 DIAGNOSIS — Z96641 Presence of right artificial hip joint: Secondary | ICD-10-CM

## 2023-03-02 NOTE — Progress Notes (Signed)
The patient comes in today for his first postoperative visit status post a right hip replacement.  He is doing excellent overall.  He has been compliant with a baby aspirin twice daily.  He is struggled to get on his TED hose.  He does have foot and ankle swelling but overall he is already transition to a cane and doing well.  On exam his right hip incision looks great.  The staples been removed and Steri-Strips applied.  There is foot and ankle swelling that is calf is soft.  He can drive from my standpoint since he is off of narcotics.  He can get his incision wet in the shower and can stop his aspirin.  If he does develop any issues before I see him in 4 weeks he will let us know.  If not we will see him in 4 weeks but no x-rays are needed.

## 2023-03-02 NOTE — Telephone Encounter (Signed)
Ortho bundle in person visit completed.

## 2023-03-03 DIAGNOSIS — M1990 Unspecified osteoarthritis, unspecified site: Secondary | ICD-10-CM | POA: Diagnosis not present

## 2023-03-03 DIAGNOSIS — M109 Gout, unspecified: Secondary | ICD-10-CM | POA: Diagnosis not present

## 2023-03-03 DIAGNOSIS — I77810 Thoracic aortic ectasia: Secondary | ICD-10-CM | POA: Diagnosis not present

## 2023-03-03 DIAGNOSIS — K5790 Diverticulosis of intestine, part unspecified, without perforation or abscess without bleeding: Secondary | ICD-10-CM | POA: Diagnosis not present

## 2023-03-03 DIAGNOSIS — I10 Essential (primary) hypertension: Secondary | ICD-10-CM | POA: Diagnosis not present

## 2023-03-03 DIAGNOSIS — Z471 Aftercare following joint replacement surgery: Secondary | ICD-10-CM | POA: Diagnosis not present

## 2023-03-06 DIAGNOSIS — Z471 Aftercare following joint replacement surgery: Secondary | ICD-10-CM | POA: Diagnosis not present

## 2023-03-06 DIAGNOSIS — I77810 Thoracic aortic ectasia: Secondary | ICD-10-CM | POA: Diagnosis not present

## 2023-03-06 DIAGNOSIS — M109 Gout, unspecified: Secondary | ICD-10-CM | POA: Diagnosis not present

## 2023-03-06 DIAGNOSIS — I10 Essential (primary) hypertension: Secondary | ICD-10-CM | POA: Diagnosis not present

## 2023-03-06 DIAGNOSIS — M1990 Unspecified osteoarthritis, unspecified site: Secondary | ICD-10-CM | POA: Diagnosis not present

## 2023-03-06 DIAGNOSIS — K5790 Diverticulosis of intestine, part unspecified, without perforation or abscess without bleeding: Secondary | ICD-10-CM | POA: Diagnosis not present

## 2023-04-03 ENCOUNTER — Ambulatory Visit (INDEPENDENT_AMBULATORY_CARE_PROVIDER_SITE_OTHER): Payer: Medicare Other | Admitting: Orthopaedic Surgery

## 2023-04-03 ENCOUNTER — Encounter: Payer: Self-pay | Admitting: Orthopaedic Surgery

## 2023-04-03 DIAGNOSIS — Z96641 Presence of right artificial hip joint: Secondary | ICD-10-CM

## 2023-04-03 NOTE — Progress Notes (Signed)
Glenn Young comes in today getting close to 2 months status post a right total hip arthroplasty.  He is an active 77 year old gentleman.  He is doing well overall and he said the last 2 weeks has made significant progress.  He still walks with just a slight limp but overall he looks good.  He lets me move his hip range of motion on the right side.  There is some stiffness but it is improving.  Even his ADLs are starting to improve some.  From my standpoint there are no restrictions other than not running.  He said he is on the way to skate until later in the year and give a little bit longer before getting back to golf.  He can get in the pool from my standpoint.  Will see him back in 6 months unless there are issues.  At that visit we will have a standing review pelvis and lateral of his right hip.

## 2023-04-25 ENCOUNTER — Other Ambulatory Visit: Payer: Self-pay | Admitting: Thoracic Surgery (Cardiothoracic Vascular Surgery)

## 2023-04-25 DIAGNOSIS — I7121 Aneurysm of the ascending aorta, without rupture: Secondary | ICD-10-CM

## 2023-06-06 ENCOUNTER — Telehealth: Payer: Self-pay | Admitting: *Deleted

## 2023-06-06 NOTE — Telephone Encounter (Signed)
Ortho bundle 90 day call completed. 

## 2023-06-12 ENCOUNTER — Ambulatory Visit
Admission: RE | Admit: 2023-06-12 | Discharge: 2023-06-12 | Disposition: A | Discharge status: Home / Self Care | Payer: Medicare Other | Source: Ambulatory Visit | Attending: Thoracic Surgery (Cardiothoracic Vascular Surgery) | Admitting: Thoracic Surgery (Cardiothoracic Vascular Surgery)

## 2023-06-12 DIAGNOSIS — I719 Aortic aneurysm of unspecified site, without rupture: Secondary | ICD-10-CM | POA: Diagnosis not present

## 2023-06-12 DIAGNOSIS — I7121 Aneurysm of the ascending aorta, without rupture: Secondary | ICD-10-CM

## 2023-06-12 MED ORDER — IOPAMIDOL (ISOVUE-370) INJECTION 76%
500.0000 mL | Freq: Once | INTRAVENOUS | Status: AC | PRN
  Administered 2023-06-12: 75 mL via INTRAVENOUS

## 2023-06-16 ENCOUNTER — Telehealth: Payer: Medicare Other | Admitting: Thoracic Surgery (Cardiothoracic Vascular Surgery)

## 2023-06-16 ENCOUNTER — Other Ambulatory Visit: Payer: Medicare Other

## 2023-06-23 ENCOUNTER — Ambulatory Visit (INDEPENDENT_AMBULATORY_CARE_PROVIDER_SITE_OTHER): Payer: Medicare Other | Admitting: Thoracic Surgery (Cardiothoracic Vascular Surgery)

## 2023-06-23 VITALS — BP 115/77 | HR 77 | Resp 18 | Ht 70.0 in | Wt 227.0 lb

## 2023-06-23 DIAGNOSIS — I7121 Aneurysm of the ascending aorta, without rupture: Secondary | ICD-10-CM

## 2023-06-23 NOTE — Progress Notes (Signed)
[address]       [place] [address]             [phone]        Glenn Young [facility] Medical Record #[id number] Date of Birth: 04-19-1946  Referring: Glenn Noble, MD Primary Care: Glenn Ates, MD Primary Cardiologist:Glenn Eldridge Dace, MD  Chief Complaint:    Chief Complaint  Patient presents with   Follow-up    CTA 8/26    History of Present Illness:     77 year old male presents in follow-up for surveillance of his ascending aorta aneurysm.  He has no complaints.  He did undergo a joint surgery and has gained some weight since his last appointment.   Past Medical History:  Diagnosis Date   Ascending aorta enlargement (HCC)    Dilation of aorta (HCC)    Diverticulosis    Elevated creatine kinase level    Elevated white blood cell count    Family history of clotting disorder    GERD (gastroesophageal reflux disease)    Gout    Hyperlipidemia    Hypertension    Hypothyroidism    Paralysis (HCC)    pt deneis   Sinusitis    Vitamin D deficiency     Past Surgical History:  Procedure Laterality Date   ESOPHAGOGASTRODUODENOSCOPY N/A 12/29/2021   Procedure: ESOPHAGOGASTRODUODENOSCOPY (EGD);  Surgeon: Willis Modena, MD;  Location: [facility];  Service: Endoscopy;  Laterality: N/A;   FINE NEEDLE ASPIRATION Bilateral 12/29/2021   Procedure: FINE NEEDLE ASPIRATION (FNA) LINEAR;  Surgeon: Willis Modena, MD;  Location: [facility];  Service: Endoscopy;  Laterality: Bilateral;   TOTAL HIP ARTHROPLASTY Right 02/17/2023   Procedure: RIGHT TOTAL HIP ARTHROPLASTY ANTERIOR APPROACH;  Surgeon: Kathryne Hitch, MD;  Location: [facility];  Service: Orthopedics;  Laterality: Right;   UPPER ESOPHAGEAL ENDOSCOPIC ULTRASOUND (EUS) Bilateral 12/29/2021   Procedure: UPPER ESOPHAGEAL ENDOSCOPIC ULTRASOUND (EUS);  Surgeon: Willis Modena, MD;  Location: [facility];  Service: Endoscopy;  Laterality: Bilateral;      Social History   Tobacco  Use  Smoking Status Former   Types: Cigarettes   Passive exposure: Never  Smokeless Tobacco Never    Social History   Substance and Sexual Activity  Alcohol Use Yes   Alcohol/week: 3.0 standard drinks of alcohol   Types: 3 Standard drinks or equivalent per week     No Known Allergies    Current Outpatient Medications  Medication Sig Dispense Refill   amLODipine-valsartan (EXFORGE) 5-160 MG tablet Take 1 tablet by mouth daily.     atorvastatin (LIPITOR) 20 MG tablet Take 20 mg by mouth 3 (three) times a week. TAKE WITH PROBIOTIC     celecoxib (CELEBREX) 100 MG capsule Take 100 mg by mouth daily.     Cholecalciferol (VITAMIN D3) 50 MCG (2000 UT) TABS Take 2,000 Units by mouth daily.     levothyroxine (SYNTHROID) 100 MCG tablet Take 100 mcg by mouth daily before breakfast.     Probiotic Product (PROBIOTIC DAILY PO) Take 1 capsule by mouth every other day.     No current facility-administered medications for this visit.    (Not in a hospital admission)   No family history on file.   Review of Systems:   Review of Systems  Constitutional: Negative.   Respiratory: Negative.    Cardiovascular: Negative.   Neurological: Negative.       Physical Exam: BP 115/77   Pulse 77   Resp 18  Ht 5\' 10"  (1.778 m)   Wt 227 lb (103 kg)   SpO2 94% Comment: RA  BMI 32.57 kg/m  Physical Exam Constitutional:      General: He is not in acute distress.    Appearance: He is normal weight.  HENT:     Head: Normocephalic and atraumatic.  Eyes:     Extraocular Movements: Extraocular movements intact.  Cardiovascular:     Rate and Rhythm: Normal rate.     Heart sounds: No murmur heard. Pulmonary:     Effort: Pulmonary effort is normal. No respiratory distress.  Abdominal:     General: Abdomen is flat. There is no distension.  Musculoskeletal:        General: Normal range of motion.     Cervical back: Normal range of motion.  Skin:    General: Skin is warm and dry.   Neurological:     Mental Status: He is alert.       Diagnostic Studies & Laboratory data:    CT chest: IMPRESSION: Grossly stable 4.7 cm Ascending thoracic aortic aneurysm. Aortic root dilatation is noted at 4.8 cm. Recommend semi-annual imaging followup by CTA or MRA and referral to cardiothoracic surgery if not already obtained. This recommendation follows 2010 ACCF/AHA/AATS/ACR/ASA/SCA/SCAI/SIR/STS/SVM Guidelines for the Diagnosis and Management of Patients With Thoracic Aortic Disease. Circulation. 2010; 121: Z610-R604. Aortic aneurysm NOS (ICD10-I71.9).   Stable 16 x 14 mm solid nodular density or lymph node is seen medial to gastric cardia as noted on prior exam. Continued attention to this abnormality on follow-up imaging is recommended.  I have independently reviewed the above radiologic studies and discussed with the patient   Recent Lab Findings: Lab Results  Component Value Date   WBC 15.5 (H) 02/18/2023   HGB 13.4 02/18/2023   HCT 40.0 02/18/2023   PLT 229 02/18/2023   GLUCOSE 183 (H) 02/18/2023   NA 132 (L) 02/18/2023   K 4.1 02/18/2023   CL 104 02/18/2023   CREATININE 1.23 02/18/2023   BUN 25 (H) 02/18/2023   CO2 19 (L) 02/18/2023      Assessment / Plan:   77 year old male with a 4.7 cm ascending aortic aneurysm.  Echocardiogram shows a tricuspid valve without evidence of regurgitation.  We discussed the natural history and and risk factors for growth of ascending aortic aneurysms.  We covered the importance of smoking cessation, tight blood pressure control, refraining from lifting heavy objects, and avoiding fluoroquinolones.  The patient is aware of signs and symptoms of aortic dissection and when to present to the emergency department.  We will continue surveillance and a repeat CT was ordered for 12 months.    I  spent 10 minutes counseling the patient face to face.   Corliss Skains 06/23/2023 10:55 AM

## 2023-07-06 DIAGNOSIS — Z23 Encounter for immunization: Secondary | ICD-10-CM | POA: Diagnosis not present

## 2023-07-11 DIAGNOSIS — E559 Vitamin D deficiency, unspecified: Secondary | ICD-10-CM | POA: Diagnosis not present

## 2023-07-11 DIAGNOSIS — M109 Gout, unspecified: Secondary | ICD-10-CM | POA: Diagnosis not present

## 2023-07-11 DIAGNOSIS — E782 Mixed hyperlipidemia: Secondary | ICD-10-CM | POA: Diagnosis not present

## 2023-07-11 DIAGNOSIS — Z0000 Encounter for general adult medical examination without abnormal findings: Secondary | ICD-10-CM | POA: Diagnosis not present

## 2023-07-11 DIAGNOSIS — Z125 Encounter for screening for malignant neoplasm of prostate: Secondary | ICD-10-CM | POA: Diagnosis not present

## 2023-07-11 DIAGNOSIS — K3189 Other diseases of stomach and duodenum: Secondary | ICD-10-CM | POA: Diagnosis not present

## 2023-07-11 DIAGNOSIS — E039 Hypothyroidism, unspecified: Secondary | ICD-10-CM | POA: Diagnosis not present

## 2023-07-11 DIAGNOSIS — I10 Essential (primary) hypertension: Secondary | ICD-10-CM | POA: Diagnosis not present

## 2023-07-11 DIAGNOSIS — I719 Aortic aneurysm of unspecified site, without rupture: Secondary | ICD-10-CM | POA: Diagnosis not present

## 2023-09-18 ENCOUNTER — Ambulatory Visit: Payer: Medicare Other | Admitting: Orthopaedic Surgery

## 2023-09-18 ENCOUNTER — Other Ambulatory Visit (INDEPENDENT_AMBULATORY_CARE_PROVIDER_SITE_OTHER): Payer: Self-pay

## 2023-09-18 ENCOUNTER — Ambulatory Visit (INDEPENDENT_AMBULATORY_CARE_PROVIDER_SITE_OTHER): Payer: Medicare Other | Admitting: Orthopaedic Surgery

## 2023-09-18 DIAGNOSIS — Z96641 Presence of right artificial hip joint: Secondary | ICD-10-CM

## 2023-09-18 NOTE — Progress Notes (Signed)
The patient following up over 6 months status post a right total hip arthroplasty.  He said he is doing great and has no issues with his right hip.  He denies any significant left hip pain but may be a little bit every now and then but minimal.  He says the right hip is doing well.  On examination his leg lengths are equal.  He tolerates me easily putting his right hip through range of motion with no pain at all.  His left hip is just slightly stiff but has shows no pain.  An AP pelvis and lateral the right hip shows a well-seated right total hip arthroplasty with no complicating features.  The left side has just some mild superior lateral narrowing and mild flattening of the femoral head.  From a right hip standpoint, follow-up can be as needed.  If he does develop any issues with the right hip he knows to reach out to [place] immediately.  If his left hip starts having any issues he knows to let us know.  He also knows we can see him for other orthopedic issues.  I believe we are seeing his wife in the near future for her hip.

## 2023-12-12 DIAGNOSIS — K648 Other hemorrhoids: Secondary | ICD-10-CM | POA: Diagnosis not present

## 2023-12-12 DIAGNOSIS — D123 Benign neoplasm of transverse colon: Secondary | ICD-10-CM | POA: Diagnosis not present

## 2023-12-12 DIAGNOSIS — Z09 Encounter for follow-up examination after completed treatment for conditions other than malignant neoplasm: Secondary | ICD-10-CM | POA: Diagnosis not present

## 2023-12-12 DIAGNOSIS — Z860101 Personal history of adenomatous and serrated colon polyps: Secondary | ICD-10-CM | POA: Diagnosis not present

## 2023-12-12 DIAGNOSIS — Z800 Family history of malignant neoplasm of digestive organs: Secondary | ICD-10-CM | POA: Diagnosis not present

## 2023-12-12 DIAGNOSIS — D122 Benign neoplasm of ascending colon: Secondary | ICD-10-CM | POA: Diagnosis not present

## 2023-12-12 DIAGNOSIS — K5730 Diverticulosis of large intestine without perforation or abscess without bleeding: Secondary | ICD-10-CM | POA: Diagnosis not present

## 2023-12-14 DIAGNOSIS — D122 Benign neoplasm of ascending colon: Secondary | ICD-10-CM | POA: Diagnosis not present

## 2023-12-14 DIAGNOSIS — D123 Benign neoplasm of transverse colon: Secondary | ICD-10-CM | POA: Diagnosis not present

## 2024-05-08 DIAGNOSIS — R197 Diarrhea, unspecified: Secondary | ICD-10-CM | POA: Diagnosis not present

## 2024-05-10 ENCOUNTER — Emergency Department (HOSPITAL_COMMUNITY)

## 2024-05-10 ENCOUNTER — Other Ambulatory Visit: Payer: Self-pay

## 2024-05-10 ENCOUNTER — Encounter (HOSPITAL_COMMUNITY): Payer: Self-pay

## 2024-05-10 ENCOUNTER — Observation Stay (HOSPITAL_COMMUNITY)
Admission: EM | Admit: 2024-05-10 | Discharge: 2024-05-11 | Disposition: A | Discharge status: Home / Self Care | Attending: Internal Medicine | Admitting: Internal Medicine

## 2024-05-10 DIAGNOSIS — Z87891 Personal history of nicotine dependence: Secondary | ICD-10-CM | POA: Diagnosis not present

## 2024-05-10 DIAGNOSIS — F1090 Alcohol use, unspecified, uncomplicated: Secondary | ICD-10-CM | POA: Insufficient documentation

## 2024-05-10 DIAGNOSIS — E039 Hypothyroidism, unspecified: Secondary | ICD-10-CM | POA: Diagnosis not present

## 2024-05-10 DIAGNOSIS — K429 Umbilical hernia without obstruction or gangrene: Secondary | ICD-10-CM | POA: Diagnosis not present

## 2024-05-10 DIAGNOSIS — I10 Essential (primary) hypertension: Secondary | ICD-10-CM | POA: Diagnosis not present

## 2024-05-10 DIAGNOSIS — Z79890 Hormone replacement therapy (postmenopausal): Secondary | ICD-10-CM | POA: Diagnosis not present

## 2024-05-10 DIAGNOSIS — A084 Viral intestinal infection, unspecified: Secondary | ICD-10-CM | POA: Insufficient documentation

## 2024-05-10 DIAGNOSIS — R5383 Other fatigue: Secondary | ICD-10-CM | POA: Diagnosis present

## 2024-05-10 DIAGNOSIS — E785 Hyperlipidemia, unspecified: Secondary | ICD-10-CM | POA: Diagnosis not present

## 2024-05-10 DIAGNOSIS — E860 Dehydration: Secondary | ICD-10-CM | POA: Diagnosis not present

## 2024-05-10 DIAGNOSIS — Z79899 Other long term (current) drug therapy: Secondary | ICD-10-CM | POA: Insufficient documentation

## 2024-05-10 DIAGNOSIS — N179 Acute kidney failure, unspecified: Principal | ICD-10-CM | POA: Diagnosis present

## 2024-05-10 DIAGNOSIS — I7121 Aneurysm of the ascending aorta, without rupture: Secondary | ICD-10-CM | POA: Diagnosis not present

## 2024-05-10 DIAGNOSIS — R197 Diarrhea, unspecified: Secondary | ICD-10-CM

## 2024-05-10 DIAGNOSIS — R066 Hiccough: Secondary | ICD-10-CM | POA: Diagnosis not present

## 2024-05-10 DIAGNOSIS — N400 Enlarged prostate without lower urinary tract symptoms: Secondary | ICD-10-CM | POA: Diagnosis not present

## 2024-05-10 LAB — URINALYSIS, ROUTINE W REFLEX MICROSCOPIC
Bilirubin Urine: NEGATIVE
Glucose, UA: NEGATIVE mg/dL
Hgb urine dipstick: NEGATIVE
Ketones, ur: NEGATIVE mg/dL
Leukocytes,Ua: NEGATIVE
Nitrite: NEGATIVE
Protein, ur: NEGATIVE mg/dL
Specific Gravity, Urine: 1.014 (ref 1.005–1.030)
pH: 5 (ref 5.0–8.0)

## 2024-05-10 LAB — COMPREHENSIVE METABOLIC PANEL WITH GFR
ALT: 80 U/L — ABNORMAL HIGH (ref 0–44)
ALT: 76 U/L — ABNORMAL HIGH (ref 0–44)
AST: 54 U/L — ABNORMAL HIGH (ref 15–41)
AST: 52 U/L — ABNORMAL HIGH (ref 15–41)
Albumin: 2.9 g/dL — ABNORMAL LOW (ref 3.5–5.0)
Albumin: 2.6 g/dL — ABNORMAL LOW (ref 3.5–5.0)
Alkaline Phosphatase: 56 U/L (ref 38–126)
Alkaline Phosphatase: 50 U/L (ref 38–126)
Anion gap: 9 (ref 5–15)
Anion gap: 11 (ref 5–15)
BUN: 64 mg/dL — ABNORMAL HIGH (ref 8–23)
BUN: 63 mg/dL — ABNORMAL HIGH (ref 8–23)
CO2: 20 mmol/L — ABNORMAL LOW (ref 22–32)
CO2: 20 mmol/L — ABNORMAL LOW (ref 22–32)
Calcium: 7.9 mg/dL — ABNORMAL LOW (ref 8.9–10.3)
Calcium: 7.8 mg/dL — ABNORMAL LOW (ref 8.9–10.3)
Chloride: 100 mmol/L (ref 98–111)
Chloride: 101 mmol/L (ref 98–111)
Creatinine, Ser: 2.86 mg/dL — ABNORMAL HIGH (ref 0.61–1.24)
Creatinine, Ser: 2.4 mg/dL — ABNORMAL HIGH (ref 0.61–1.24)
GFR, Estimated: 22 mL/min — ABNORMAL LOW (ref >60)
GFR, Estimated: 27 mL/min — ABNORMAL LOW (ref >60)
Glucose, Bld: 129 mg/dL — ABNORMAL HIGH (ref 70–99)
Glucose, Bld: 92 mg/dL (ref 70–99)
Potassium: 4 mmol/L (ref 3.5–5.1)
Potassium: 4.3 mmol/L (ref 3.5–5.1)
Sodium: 129 mmol/L — ABNORMAL LOW (ref 135–145)
Sodium: 132 mmol/L — ABNORMAL LOW (ref 135–145)
Total Bilirubin: 0.9 mg/dL (ref 0.0–1.2)
Total Bilirubin: 0.8 mg/dL (ref 0.0–1.2)
Total Protein: 7 g/dL (ref 6.5–8.1)
Total Protein: 5.9 g/dL — ABNORMAL LOW (ref 6.5–8.1)

## 2024-05-10 LAB — CBC WITH DIFFERENTIAL/PLATELET
Abs Immature Granulocytes: 0.05 K/uL (ref 0.00–0.07)
Basophils Absolute: 0.1 K/uL (ref 0.0–0.1)
Basophils Relative: 1 %
Eosinophils Absolute: 0 K/uL (ref 0.0–0.5)
Eosinophils Relative: 0 %
HCT: 41.7 % (ref 39.0–52.0)
Hemoglobin: 13.9 g/dL (ref 13.0–17.0)
Immature Granulocytes: 1 %
Lymphocytes Relative: 43 %
Lymphs Abs: 4.6 K/uL — ABNORMAL HIGH (ref 0.7–4.0)
MCH: 28.8 pg (ref 26.0–34.0)
MCHC: 33.3 g/dL (ref 30.0–36.0)
MCV: 86.3 fL (ref 80.0–100.0)
Monocytes Absolute: 0.7 K/uL (ref 0.1–1.0)
Monocytes Relative: 6 %
Neutro Abs: 5.3 K/uL (ref 1.7–7.7)
Neutrophils Relative %: 49 %
Platelets: 163 K/uL (ref 150–400)
RBC: 4.83 MIL/uL (ref 4.22–5.81)
RDW: 13.7 % (ref 11.5–15.5)
WBC: 10.7 K/uL — ABNORMAL HIGH (ref 4.0–10.5)
nRBC: 0 % (ref 0.0–0.2)

## 2024-05-10 LAB — CK: Total CK: 90 U/L (ref 49–397)

## 2024-05-10 MED ORDER — METOPROLOL TARTRATE 5 MG/5ML IV SOLN: 5.0000 mg | Freq: Three times a day (TID) | INTRAVENOUS | Status: DC | PRN

## 2024-05-10 MED ORDER — ONDANSETRON HCL 4 MG/2ML IJ SOLN: 4.0000 mg | Freq: Four times a day (QID) | INTRAMUSCULAR | Status: DC | PRN

## 2024-05-10 MED ORDER — ENOXAPARIN SODIUM 40 MG/0.4ML IJ SOSY
40.0000 mg | PREFILLED_SYRINGE | INTRAMUSCULAR | Status: DC
  Administered 2024-05-10: 40 mg via SUBCUTANEOUS
  Filled 2024-05-10: qty 0.4

## 2024-05-10 MED ORDER — ENOXAPARIN SODIUM 30 MG/0.3ML IJ SOSY: 30.0000 mg | PREFILLED_SYRINGE | INTRAMUSCULAR | Status: DC

## 2024-05-10 MED ORDER — ACETAMINOPHEN 650 MG RE SUPP: 650.0000 mg | Freq: Four times a day (QID) | RECTAL | Status: DC | PRN

## 2024-05-10 MED ORDER — LEVOTHYROXINE SODIUM 100 MCG PO TABS
100.0000 ug | ORAL_TABLET | Freq: Every day | ORAL | Status: DC
  Administered 2024-05-11: 100 ug via ORAL
  Filled 2024-05-10: qty 1

## 2024-05-10 MED ORDER — ONDANSETRON HCL 4 MG/2ML IJ SOLN
4.0000 mg | Freq: Once | INTRAMUSCULAR | Status: AC
  Administered 2024-05-10: 4 mg via INTRAVENOUS
  Filled 2024-05-10: qty 2

## 2024-05-10 MED ORDER — SENNOSIDES-DOCUSATE SODIUM 8.6-50 MG PO TABS
1.0000 | ORAL_TABLET | Freq: Every evening | ORAL | Status: DC | PRN
Start: 2024-05-10 — End: 2024-05-11

## 2024-05-10 MED ORDER — ONDANSETRON HCL 4 MG PO TABS: 4.0000 mg | ORAL_TABLET | Freq: Four times a day (QID) | ORAL | Status: DC | PRN

## 2024-05-10 MED ORDER — LACTATED RINGERS IV BOLUS
1000.0000 mL | Freq: Once | INTRAVENOUS | Status: AC
  Administered 2024-05-10: 1000 mL via INTRAVENOUS

## 2024-05-10 MED ORDER — LACTATED RINGERS IV SOLN: INTRAVENOUS | Status: DC

## 2024-05-10 MED ORDER — ACETAMINOPHEN 325 MG PO TABS: 650.0000 mg | ORAL_TABLET | Freq: Four times a day (QID) | ORAL | Status: DC | PRN

## 2024-05-10 NOTE — ED Notes (Signed)
Pt drinking water and tolerating well.  

## 2024-05-10 NOTE — ED Notes (Signed)
Called lab to add on CK

## 2024-05-10 NOTE — ED Notes (Signed)
 Called 5E to notify pt will be coming up.

## 2024-05-10 NOTE — ED Triage Notes (Signed)
 POV/ by self/ sent by PCP for dehydration and poss AKI/ pt started having diarrhea on Monday/ pt currently not having diarrhea but c/o hypotension and overall not feeling well/ pt is ambulatory and A&OX4

## 2024-05-10 NOTE — ED Provider Notes (Signed)
 [place] EMERGENCY DEPARTMENT AT [facility] Provider Note   CSN: [id number] Arrival date & time: 05/10/24  1054    Patient presents with: Dehydration   Glenn Young is a 78 y.o. male here for evaluation of possible dehydration.  Recently returned from vacationing [place] [address].  Started with profuse watery diarrhea, fatigue and generalized weakness.  He noted some low blood pressure at home.  Diarrhea resolved after taking Imodium however due to persistent fatigue and low blood pressure at home was sent here about a PCP office.  No fever, chest pain, abdominal pain, bloody stool, dysuria or hematuria.  He has had recurrent hiccups.  No one else has been sick.  No pain or swelling to lower legs.  No history of PE or DVT.  No cough.   HPI     Prior to Admission medications   Medication Sig Start Date End Date Taking? Authorizing Provider  amLODipine -valsartan  (EXFORGE ) 5-160 MG tablet Take 1 tablet by mouth daily.    [provider]  atorvastatin  (LIPITOR) 20 MG tablet Take 20 mg by mouth 3 (three) times a week. TAKE WITH PROBIOTIC    [provider]  celecoxib (CELEBREX) 100 MG capsule Take 100 mg by mouth daily. 10/13/22   [provider]  Cholecalciferol  (VITAMIN D3) 50 MCG (2000 UT) TABS Take 2,000 Units by mouth daily.    [provider]  levothyroxine  (SYNTHROID ) 100 MCG tablet Take 100 mcg by mouth daily before breakfast.    [provider]  Probiotic Product (PROBIOTIC DAILY PO) Take 1 capsule by mouth every other day.    [provider]    Allergies: Patient has no known allergies.    Review of Systems  Constitutional: Negative.   HENT: Negative.    Respiratory: Negative.    Cardiovascular:  Negative for chest pain, palpitations and leg swelling.       DOE  Gastrointestinal:  Positive for diarrhea. Negative for abdominal distention, abdominal pain, anal bleeding, blood in stool, constipation, nausea and  vomiting.  Genitourinary: Negative.   Musculoskeletal: Negative.   Skin: Negative.   Neurological:  Positive for weakness (generalized).  All other systems reviewed and are negative.   Updated Vital Signs BP 123/81   Pulse 77   Temp 98.9 F (37.2 C)   Resp 16   Wt 98 kg   SpO2 98%   BMI 30.99 kg/m   Physical Exam Vitals and nursing note reviewed.  Constitutional:      General: He is not in acute distress.    Appearance: He is well-developed. He is not ill-appearing, toxic-appearing or diaphoretic.     Comments: Hiccups  HENT:     Head: Normocephalic and atraumatic.     Nose: Nose normal.     Mouth/Throat:     Mouth: Mucous membranes are dry.  Eyes:     Pupils: Pupils are equal, round, and reactive to light.  Cardiovascular:     Rate and Rhythm: Normal rate and regular rhythm.     Pulses: Normal pulses.     Heart sounds: Normal heart sounds.  Pulmonary:     Effort: Pulmonary effort is normal. No respiratory distress.     Breath sounds: Normal breath sounds.  Abdominal:     General: Bowel sounds are normal. There is no distension.     Palpations: Abdomen is soft.     Tenderness: There is no abdominal tenderness. There is no right CVA tenderness, left CVA tenderness or guarding.  Musculoskeletal:        General: No swelling or tenderness. Normal range of motion.     Cervical back: Normal range of motion and neck supple.     Right lower leg: No edema.     Left lower leg: No edema.  Skin:    General: Skin is warm and dry.     Capillary Refill: Capillary refill takes less than 2 seconds.  Neurological:     General: No focal deficit present.     Mental Status: He is alert and oriented to person, place, and time.    (all labs ordered are listed, but only abnormal results are displayed) Labs Reviewed  CBC WITH DIFFERENTIAL/PLATELET - Abnormal; Notable for the following components:      Result Value   WBC 10.7 (*)    Lymphs Abs 4.6 (*)    All other components  within normal limits  COMPREHENSIVE METABOLIC PANEL WITH GFR - Abnormal; Notable for the following components:   Sodium 129 (*)    CO2 20 (*)    Glucose, Bld 129 (*)    BUN 64 (*)    Creatinine, Ser 2.86 (*)    Calcium  7.9 (*)    Albumin 2.9 (*)    AST 54 (*)    ALT 80 (*)    GFR, Estimated 22 (*)    All other components within normal limits  URINALYSIS, ROUTINE W REFLEX MICROSCOPIC - Abnormal; Notable for the following components:   APPearance HAZY (*)    All other components within normal limits  COMPREHENSIVE METABOLIC PANEL WITH GFR - Abnormal; Notable for the following components:   Sodium 132 (*)    CO2 20 (*)    BUN 63 (*)    Creatinine, Ser 2.40 (*)    Calcium  7.8 (*)    Total Protein 5.9 (*)    Albumin 2.6 (*)    AST 52 (*)    ALT 76 (*)    GFR, Estimated 27 (*)    All other components within normal limits  CK  HEPATITIS PANEL, ACUTE  CBC  CREATININE, SERUM  BASIC METABOLIC PANEL WITH GFR  CBC  MAGNESIUM     EKG: None  Radiology: CT CHEST ABDOMEN PELVIS WO CONTRAST Result Date: 05/10/2024 CLINICAL DATA:  sob, abd pain, hiccups, aki. EXAM: CT CHEST, ABDOMEN AND PELVIS WITHOUT CONTRAST TECHNIQUE: Multidetector CT imaging of the chest, abdomen and pelvis was performed following the standard protocol without IV contrast. RADIATION DOSE REDUCTION: This exam was performed according to the departmental dose-optimization program which includes automated exposure control, adjustment of the mA and/or kV according to patient size and/or use of iterative reconstruction technique. COMPARISON:  CT Angiography chest from 06/12/2023 and CT scan abdomen and pelvis from 01/28/2004. FINDINGS: CT CHEST FINDINGS Cardiovascular: Normal cardiac size. No pericardial effusion. Redemonstration of aneurysmal dilation of the ascending aorta measuring upto 4.7 cm in diameter. There are coronary artery calcifications, in keeping with coronary artery disease. There are also minimal peripheral  atherosclerotic vascular calcifications of thoracic aorta and its major branches. Mediastinum/Nodes: Visualized thyroid  gland appears grossly unremarkable. No solid / cystic mediastinal masses. The esophagus is nondistended precluding optimal assessment. No mediastinal or axillary lymphadenopathy by size criteria. Evaluation of bilateral hila is limited due to lack on intravenous contrast: however, no large hilar lymphadenopathy identified. Lungs/Pleura: The central tracheo-bronchial tree is patent. No mass or consolidation. No pleural effusion or pneumothorax. No suspicious lung nodules. Musculoskeletal: The visualized soft tissues of the chest wall are grossly  unremarkable. No suspicious osseous lesions. There are mild multilevel degenerative changes in the visualized spine. CT ABDOMEN PELVIS FINDINGS Hepatobiliary: The liver is normal in size. Non-cirrhotic configuration. No suspicious mass. No intrahepatic or extrahepatic bile duct dilation. No calcified gallstones. Normal gallbladder wall thickness. No pericholecystic inflammatory changes. Pancreas: There are few dystrophic calcifications in the pancreatic head and pancreatic tail, likely sequela of chronic pancreatitis. Otherwise unremarkable pancreas. No focal mass. No peripancreatic fat stranding. Main pancreatic duct is not dilated. Spleen: Within normal limits. No focal lesion. Adrenals/Urinary Tract: Adrenal glands are unremarkable. No suspicious renal mass within the limitations of this unenhanced exam. No nephroureterolithiasis or obstructive uropathy. Unremarkable urinary bladder. Stomach/Bowel: There is a tiny sliding hiatal hernia. No disproportionate dilation of the small or large bowel loops. No evidence of abnormal bowel wall thickening or inflammatory changes. The appendix is unremarkable. There are multiple diverticula mainly in the left hemi colon, without imaging signs of diverticulitis. Redemonstration of well-circumscribed oval 1.6 x 1.8 cm  density adjacent to the fundus/cardia of the stomach, incompletely characterized on the current exam but present since the prior study dating back to 2005 and favored benign in etiology. Vascular/Lymphatic: No ascites or pneumoperitoneum. No abdominal or pelvic lymphadenopathy, by size criteria. No aneurysmal dilation of the major abdominal arteries. There are mild peripheral atherosclerotic vascular calcifications of the aorta and its major branches. Reproductive: Enlarged prostate. Symmetric seminal vesicles. Other: There is a tiny fat containing periumbilical hernia. The soft tissues and abdominal wall are otherwise unremarkable. Musculoskeletal: No suspicious osseous lesions. There are mild - moderate multilevel degenerative changes in the visualized spine. Right hip arthroplasty noted. IMPRESSION: 1. No acute inflammatory process identified within the chest, abdomen or pelvis. No bowel obstruction. 2. No lung mass, consolidation, pleural effusion or pneumothorax. 3. 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 4. Essentially unremarkable exam, as described above. Aortic aneurysm NOS (ICD10-I71.9). Aortic Atherosclerosis (ICD10-I70.0). Electronically Signed   By: Ree Molt M.D.   On: 05/10/2024 16:48     Procedures   Medications Ordered in the ED  levothyroxine  (SYNTHROID ) tablet 100 mcg (has no administration in time range)  lactated ringers  infusion (has no administration in time range)  enoxaparin  (LOVENOX ) injection 40 mg (has no administration in time range)  ondansetron  (ZOFRAN ) tablet 4 mg (has no administration in time range)    Or  ondansetron  (ZOFRAN ) injection 4 mg (has no administration in time range)  senna-docusate (Senokot-S) tablet 1 tablet (has no administration in time range)  acetaminophen  (TYLENOL ) tablet 650 mg (has no administration in time range)    Or  acetaminophen  (TYLENOL ) suppository 650 mg (has no administration in time range)   metoprolol  tartrate (LOPRESSOR ) injection 5 mg (has no administration in time range)  lactated ringers  bolus 1,000 mL (0 mLs Intravenous Stopped 05/10/24 1748)  ondansetron  (ZOFRAN ) injection 4 mg (4 mg Intravenous Given 05/10/24 3834)   78 year old here for evaluation of diarrhea fatigue and low blood pressure.  Recent travel noted profuse diarrhea without any blood.  Diarrhea resolved after Imodium however still feeling weak and fatigued.  No chest pain.  No abdominal pain.  No sick contacts.  Has had recurrent hiccups.  No history of PE or DVT.  No pain or swelling to lower legs.  Will plan on labs, imaging and reassess  Labs and imaging personally viewed and interpret:  CBC leukocytosis 10.7 Metabolic panel sodium 129, creatinine 2.86, elevated LFTs--- will add on CK UA negative for infection,  blood CK 90 CT C/A/P no significant abnormality  Give IV fluids, antiemetics.  Will admit for AKI, baseline creatinine 1.2  Discussed with Dr. Nikki will admit to medicine for further workup  The patient appears reasonably stabilized for admission considering the current resources, flow, and capabilities available in the ED at this time, and I doubt any other [place] requiring further screening and/or treatment in the ED prior to admission.    Clinical Course as of 05/10/24 2004  Fri May 10, 2024  1806 Neg orthostatic VS [BH]    Clinical Course User Index [BH] Davaughn Hillyard A, PA-C                                 Medical Decision Making Amount and/or Complexity of Data Reviewed External Data Reviewed: labs, radiology and notes. Labs: ordered. Decision-making details documented in ED Course. Radiology: ordered and independent interpretation performed. Decision-making details documented in ED Course.  Risk OTC drugs. Prescription drug management. Parenteral controlled substances. Decision regarding hospitalization. Diagnosis or treatment significantly limited by social determinants of  health.       Final diagnoses:  AKI (acute kidney injury) (HCC)  Diarrhea, unspecified type  Hiccups    ED Discharge Orders     None          Sun Kihn A, PA-C 05/10/24 2004    Pamella Ozell LABOR, DO 05/12/24 1837

## 2024-05-10 NOTE — H&P (Signed)
 History and Physical    Patient: Glenn Young FMW:[id number] DOB: 1946/10/13 DOA: 05/10/2024 DOS: the patient was seen and examined on 05/10/2024 PCP: [name], MD  Patient coming from: Home  Chief Complaint:  Chief Complaint  Patient presents with   Dehydration   HPI: Glenn Young is a 78 y.o. male with medical history significant of hypertension, hyperlipidemia, hypothyroidism, GERD and diverticulosis presented to the emergency department complaining of fatigue/generalized weakness after being seen by his PCP and found to be dehydrated with low blood pressures.  Previously patient report having profuse watery diarrhea, nausea and vomiting that started after he returned from a trip in the [place] [address].  He took some Imodium which helped with diarrhea.  Last bowel movement this morning.  Patient currently denies any abdominal pain, dysuria, hematuria, chest pain, shortness of breath, fevers and bloody stools.  He does endorse some hiccups.  He denies any known sick contact.  He does not recall eating anything out of ordinary however he was eating out a lot.  He did not took his blood pressure medications this morning due to low blood pressures.  ED evaluation: Found to be on AKI, sCR 2.8 (baseline 1.2-1.4), GFR 22, mildly elevated LFTs , Slightly elevated white count at 10.7, and hyponatremic.  He was given 1 L of IV fluid, CMP was repeated with mild improvement, creatinine 2.4, LFTs minimally trending down.  GFR 27.  [facility] called for admission.   Review of Systems: As mentioned in the history of present illness. All other systems reviewed and are negative.  Past Medical History:  Diagnosis Date   Ascending aorta enlargement (HCC)    Dilation of aorta (HCC)    Diverticulosis    Elevated creatine kinase level    Elevated white blood cell count    Family history of clotting disorder    GERD (gastroesophageal reflux disease)    Gout    Hyperlipidemia    Hypertension     Hypothyroidism    Paralysis (HCC)    pt deneis   Sinusitis    Vitamin D  deficiency    Past Surgical History:  Procedure Laterality Date   ESOPHAGOGASTRODUODENOSCOPY N/A 12/29/2021   Procedure: ESOPHAGOGASTRODUODENOSCOPY (EGD);  Surgeon: Burnette Fallow, MD;  Location: [facility];  Service: Endoscopy;  Laterality: N/A;   FINE NEEDLE ASPIRATION Bilateral 12/29/2021   Procedure: FINE NEEDLE ASPIRATION (FNA) LINEAR;  Surgeon: Burnette Fallow, MD;  Location: [facility];  Service: Endoscopy;  Laterality: Bilateral;   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 His smoking use included cigarettes. He has never been exposed to tobacco smoke. He has never used smokeless tobacco. He reports current alcohol use of about 3.0 standard drinks of alcohol per week. He reports that he does not use drugs.  No Known Allergies  History reviewed. No pertinent family history.  Prior to Admission medications   Medication Sig Start Date End Date Taking? Authorizing Provider  amLODipine -valsartan  (EXFORGE ) 5-160 MG tablet Take 1 tablet by mouth daily.    [provider]  atorvastatin  (LIPITOR) 20 MG tablet Take 20 mg by mouth 3 (  three) times a week. TAKE WITH PROBIOTIC    [provider]  celecoxib (CELEBREX) 100 MG capsule Take 100 mg by mouth daily. 10/13/22   [provider]  Cholecalciferol  (VITAMIN D3) 50 MCG (2000 UT) TABS Take 2,000 Units by mouth daily.    [provider]  levothyroxine  (SYNTHROID ) 100 MCG tablet Take 100 mcg by mouth  daily before breakfast.    [provider]  Probiotic Product (PROBIOTIC DAILY PO) Take 1 capsule by mouth every other day.    [provider]    Physical Exam: Vitals:   05/10/24 1615 05/10/24 1720 05/10/24 1745 05/10/24 1800  BP: 102/80  113/87 123/81  Pulse: 70 78 81 77  Resp:    16  Temp:    98.9 F (37.2 C)  TempSrc:      SpO2: 100% 98% 100% 98%  Weight:        Constitutional: NAD, calm, comfortable Eyes: PERRL, lids and conjunctivae normal ENMT: Mucous membranes are moist. Posterior pharynx clear of any exudate or lesions.Normal dentition.  Neck: normal, supple, no masses, no thyromegaly Respiratory: clear to auscultation bilaterally, no wheezing, no crackles. Normal respiratory effort. No accessory muscle use.  Cardiovascular: Regular rate and rhythm, no murmurs / rubs / gallops. No extremity edema. 2+ pedal pulses. No carotid bruits.  Abdomen: no tenderness, no masses palpated. No hepatosplenomegaly. Bowel sounds positive.  Musculoskeletal: no clubbing / cyanosis. No joint deformity upper and lower extremities. Good ROM, no contractures. Normal muscle tone.  Skin: no rashes, lesions, ulcers. No induration Neurologic: CN 2-12 grossly intact. Sensation intact, DTR normal. Strength 5/5 in all 4.  Psychiatric: Normal judgment and insight. Alert and oriented x 3. Normal mood.     Assessment and Plan: Service: Hospitalist  #AKI due to dehydration from diarrhea Diarrhea likely due to viral gastroenteritis.  Will place in observation, advance diet as tolerated, did p.o. challenge in the ED and did okay.  Continue IV fluid 75 mL/hour, avoid nephrotoxic agents.  Check renal function in AM.  Check hepatitis panel.   #Hypertension Chronic, stable, blood pressure improved after IV fluids.  Will hold home ARB's for now.  Metoprolol  as needed for SBP > 170  #Hypothyroidism Chronic, continue home dose Synthroid   #Hyperlipidemia Chronic, takes Lipitor 3 times a  week, resume home management upon discharge.   DVT prophylaxis: Lovenox   Advance Care Planning:   Code Status: Full Code   Consults: None  Family Communication: Wife at bedside  Disposition Plan: Anticipate discharge to previous home environment.   Severity of Illness: The appropriate patient status for this patient is OBSERVATION. Observation status is judged to be reasonable and necessary in order to provide the required intensity of service to ensure the patient's safety. The patient's presenting symptoms, physical exam findings, and initial radiographic and laboratory data in the context of their medical condition is felt to place them at decreased risk for further clinical deterioration. Furthermore, it is anticipated that the patient will be medically stable for discharge from the hospital within 2 midnights of admission.   Author: Aliene Cary, MD 05/10/2024 7:58 PM  For on call review [email or web address].

## 2024-05-10 NOTE — ED Provider Triage Note (Signed)
 Emergency Medicine Provider Triage Evaluation Note  Glenn Young , a 78 y.o. male  was evaluated in triage.  Pt complains of diarrhea, fatigue, low blood pressure.  Review of Systems  Positive: Diarrhea, fatigue, low blood pressure Negative: Fever, chills, nausea, vomiting, chest pain, shortness of breath, abdominal pain  Physical Exam  BP 96/72 (BP Location: Left Arm)   Pulse 93   Temp 97.7 F (36.5 C) (Oral)   Resp 18   Wt 98 kg   SpO2 95%   BMI 30.99 kg/m  Gen:   Awake, no distress   Resp:  Normal effort  MSK:   Moves extremities without difficulty  Other:  Fatigued-appearing  Medical Decision Making  Medically screening exam initiated at 11:39 AM.  Appropriate orders placed.  Glenn Young was informed that the remainder of the evaluation will be completed by another provider, this initial triage assessment does not replace that evaluation, and the importance of remaining in the ED until their evaluation is complete.  Labs ordered   Glenn Ileana SAILOR, PA-C 05/10/24 1140

## 2024-05-11 DIAGNOSIS — K529 Noninfective gastroenteritis and colitis, unspecified: Secondary | ICD-10-CM

## 2024-05-11 DIAGNOSIS — I10 Essential (primary) hypertension: Secondary | ICD-10-CM | POA: Diagnosis not present

## 2024-05-11 DIAGNOSIS — N179 Acute kidney failure, unspecified: Secondary | ICD-10-CM | POA: Diagnosis not present

## 2024-05-11 LAB — CBC
HCT: 38.1 % — ABNORMAL LOW (ref 39.0–52.0)
Hemoglobin: 12.7 g/dL — ABNORMAL LOW (ref 13.0–17.0)
MCH: 29.2 pg (ref 26.0–34.0)
MCHC: 33.3 g/dL (ref 30.0–36.0)
MCV: 87.6 fL (ref 80.0–100.0)
Platelets: 157 K/uL (ref 150–400)
RBC: 4.35 MIL/uL (ref 4.22–5.81)
RDW: 13.7 % (ref 11.5–15.5)
WBC: 8.8 K/uL (ref 4.0–10.5)
nRBC: 0 % (ref 0.0–0.2)

## 2024-05-11 LAB — BASIC METABOLIC PANEL WITH GFR
Anion gap: 9 (ref 5–15)
BUN: 58 mg/dL — ABNORMAL HIGH (ref 8–23)
CO2: 22 mmol/L (ref 22–32)
Calcium: 7.9 mg/dL — ABNORMAL LOW (ref 8.9–10.3)
Chloride: 104 mmol/L (ref 98–111)
Creatinine, Ser: 2.11 mg/dL — ABNORMAL HIGH (ref 0.61–1.24)
GFR, Estimated: 32 mL/min — ABNORMAL LOW (ref >60)
Glucose, Bld: 87 mg/dL (ref 70–99)
Potassium: 4.4 mmol/L (ref 3.5–5.1)
Sodium: 135 mmol/L (ref 135–145)

## 2024-05-11 LAB — HEPATITIS PANEL, ACUTE
HCV Ab: NONREACTIVE
Hep A IgM: NONREACTIVE
Hep B C IgM: NONREACTIVE
Hepatitis B Surface Ag: NONREACTIVE

## 2024-05-11 LAB — MAGNESIUM: Magnesium: 2.6 mg/dL — ABNORMAL HIGH (ref 1.7–2.4)

## 2024-05-11 MED ORDER — ONDANSETRON HCL 4 MG PO TABS: 4.0000 mg | ORAL_TABLET | Freq: Four times a day (QID) | ORAL | 0 refills | Status: AC | PRN

## 2024-05-11 NOTE — Progress Notes (Signed)
   05/11/24 1219  TOC Brief Assessment  Insurance and Status Reviewed  Patient has primary care physician Yes  Home environment has been reviewed home  Prior level of function: independent  Prior/Current Home Services No current home services  Social Drivers of Health Review SDOH reviewed no interventions necessary  Readmission risk has been reviewed Yes  Transition of care needs no transition of care needs at this time

## 2024-05-11 NOTE — Plan of Care (Signed)

## 2024-05-11 NOTE — Discharge Summary (Signed)
 Physician Discharge Summary   Patient: Glenn Young MRN: [id number] DOB: Jan 31, 1946  Admit date:     05/10/2024  Discharge date: 05/11/24  Discharge Physician: [name]   PCP: [name], MD   Recommendations at discharge:    Pt to be discharged home.   If you experience worsening fever, chills, chest pain, shortness of breath, or other concerning symptoms, please call your PCP or go to the emergency department immediately.  Discharge Diagnoses: Principal Problem:   AKI (acute kidney injury) (HCC)  Resolved Problems:   * No resolved hospital problems. *   Hospital Course:  78 y.o. male with medical history significant of hypertension, hyperlipidemia, hypothyroidism, GERD and diverticulosis presented to the emergency department complaining of fatigue/generalized weakness after being seen by his PCP and found to be dehydrated with low blood pressures.  Previously patient report having profuse watery diarrhea, nausea and vomiting that started after he returned from a trip in the [place] [address].  He took some Imodium which helped with diarrhea.  Last bowel movement this morning.  Patient currently denies any abdominal pain, dysuria, hematuria, chest pain, shortness of breath, fevers and bloody stools.  He does endorse some hiccups.  He denies any known sick contact.  He does not recall eating anything out of ordinary however he was eating out a lot.  He did not took his blood pressure medications this morning due to low blood pressures.   ED evaluation: Found to be on AKI, sCR 2.8 (baseline 1.2-1.4), GFR 22, mildly elevated LFTs , Slightly elevated white count at 10.7, and hyponatremic.  He was given 1 L of IV fluid, CMP was repeated with mild improvement, creatinine 2.4, LFTs minimally trending down.  GFR 27.  [facility] called for admission.  Assessment and Plan:  Acute kidney injury - Likely prerenal etiology secondary to diffuse diarrhea.  Showed improvement with aggressive IV  fluid hydration.  Would recommend continued aggressive oral hydration upon discharge.  Would recommend holding losartan and NSAIDs such as naproxen for now.  Can follow-up with primary care provider.  Likely acute viral gastroenteritis - Appears to be resolved at this time.  Hypertension - Somewhat low blood pressures on presentation secondary to dehydration.  Blood pressure appears stable at this time, systolics in the 120s.  Would continue to hold amlodipine /valsartan  home regiment.  Will follow-up with primary care provider to discuss whether restarting regimen is warranted.   Consultants: None Procedures performed: None Disposition: Home Diet recommendation:  Discharge Diet Orders (From admission, onward)     Start     Ordered   05/11/24 0000  Diet - low sodium heart healthy        05/11/24 1112           Cardiac diet  DISCHARGE MEDICATION: Allergies as of 05/11/2024   No Known Allergies      Medication List     STOP taking these medications    amLODipine -valsartan  5-160 MG tablet Commonly known as: EXFORGE    naproxen sodium 220 MG tablet Commonly known as: ALEVE       TAKE these medications    atorvastatin  20 MG tablet Commonly known as: LIPITOR Take 20 mg by mouth every Monday, Wednesday, and Friday at 8 PM.   CeleBREX 100 MG capsule Generic drug: celecoxib Take 100 mg by mouth daily as needed for mild pain (pain score 1-3).   levothyroxine  100 MCG tablet Commonly known as: SYNTHROID  Take 100 mcg by mouth daily before breakfast.  omeprazole  20 MG tablet Commonly known as: PRILOSEC  OTC Take 20 mg by mouth daily before breakfast.   ondansetron  4 MG tablet Commonly known as: ZOFRAN  Take 1 tablet (4 mg total) by mouth every 6 (six) hours as needed for nausea.   PROBIOTIC DAILY PO Take 1 capsule by mouth every Monday, Wednesday, and Friday at 8 PM.   Vitamin D3 50 MCG (2000 UT) Tabs Take 2,000 Units by mouth daily.         Discharge  Exam: Filed Weights   05/10/24 1129  Weight: 98 kg    GENERAL:  Alert, pleasant, no acute distress  HEENT:  EOMI CARDIOVASCULAR:  RRR, no murmurs appreciated RESPIRATORY:  Clear to auscultation, no wheezing, rales, or rhonchi GASTROINTESTINAL:  Soft, nontender, nondistended EXTREMITIES:  No LE edema bilaterally NEURO:  No new focal deficits appreciated SKIN:  No rashes noted PSYCH:  Appropriate mood and affect     Condition at discharge: improving  The results of significant diagnostics from this hospitalization (including imaging, microbiology, ancillary and laboratory) are listed below for reference.   Imaging Studies: CT CHEST ABDOMEN PELVIS WO CONTRAST Result Date: 05/10/2024 CLINICAL DATA:  sob, abd pain, hiccups, aki. EXAM: CT CHEST, ABDOMEN AND PELVIS WITHOUT CONTRAST TECHNIQUE: Multidetector CT imaging of the chest, abdomen and pelvis was performed following the standard protocol without IV contrast. RADIATION DOSE REDUCTION: This exam was performed according to the departmental dose-optimization program which includes automated exposure control, adjustment of the mA and/or kV according to patient size and/or use of iterative reconstruction technique. COMPARISON:  CT Angiography chest from 06/12/2023 and CT scan abdomen and pelvis from 01/28/2004. FINDINGS: CT CHEST FINDINGS Cardiovascular: Normal cardiac size. No pericardial effusion. Redemonstration of aneurysmal dilation of the ascending aorta measuring upto 4.7 cm in diameter. There are coronary artery calcifications, in keeping with coronary artery disease. There are also minimal peripheral atherosclerotic vascular calcifications of thoracic aorta and its major branches. Mediastinum/Nodes: Visualized thyroid  gland appears grossly unremarkable. No solid / cystic mediastinal masses. The esophagus is nondistended precluding optimal assessment. No mediastinal or axillary lymphadenopathy by size criteria. Evaluation of bilateral hila  is limited due to lack on intravenous contrast: however, no large hilar lymphadenopathy identified. Lungs/Pleura: The central tracheo-bronchial tree is patent. No mass or consolidation. No pleural effusion or pneumothorax. No suspicious lung nodules. Musculoskeletal: The visualized soft tissues of the chest wall are grossly unremarkable. No suspicious osseous lesions. There are mild multilevel degenerative changes in the visualized spine. CT ABDOMEN PELVIS FINDINGS Hepatobiliary: The liver is normal in size. Non-cirrhotic configuration. No suspicious mass. No intrahepatic or extrahepatic bile duct dilation. No calcified gallstones. Normal gallbladder wall thickness. No pericholecystic inflammatory changes. Pancreas: There are few dystrophic calcifications in the pancreatic head and pancreatic tail, likely sequela of chronic pancreatitis. Otherwise unremarkable pancreas. No focal mass. No peripancreatic fat stranding. Main pancreatic duct is not dilated. Spleen: Within normal limits. No focal lesion. Adrenals/Urinary Tract: Adrenal glands are unremarkable. No suspicious renal mass within the limitations of this unenhanced exam. No nephroureterolithiasis or obstructive uropathy. Unremarkable urinary bladder. Stomach/Bowel: There is a tiny sliding hiatal hernia. No disproportionate dilation of the small or large bowel loops. No evidence of abnormal bowel wall thickening or inflammatory changes. The appendix is unremarkable. There are multiple diverticula mainly in the left hemi colon, without imaging signs of diverticulitis. Redemonstration of well-circumscribed oval 1.6 x 1.8 cm density adjacent to the fundus/cardia of the stomach, incompletely characterized on the current exam but present since the prior study  dating back to 2005 and favored benign in etiology. Vascular/Lymphatic: No ascites or pneumoperitoneum. No abdominal or pelvic lymphadenopathy, by size criteria. No aneurysmal dilation of the major abdominal  arteries. There are mild peripheral atherosclerotic vascular calcifications of the aorta and its major branches. Reproductive: Enlarged prostate. Symmetric seminal vesicles. Other: There is a tiny fat containing periumbilical hernia. The soft tissues and abdominal wall are otherwise unremarkable. Musculoskeletal: No suspicious osseous lesions. There are mild - moderate multilevel degenerative changes in the visualized spine. Right hip arthroplasty noted. IMPRESSION: 1. No acute inflammatory process identified within the chest, abdomen or pelvis. No bowel obstruction. 2. No lung mass, consolidation, pleural effusion or pneumothorax. 3. 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 4. Essentially unremarkable exam, as described above. Aortic aneurysm NOS (ICD10-I71.9). Aortic Atherosclerosis (ICD10-I70.0). Electronically Signed   By: Ree Molt M.D.   On: 05/10/2024 16:48    Microbiology: Results for orders placed or performed during the hospital encounter of 02/07/23  Surgical pcr screen     Status: None   Collection Time: 02/07/23  8:35 AM   Specimen: Nasal Mucosa; Nasal Swab  Result Value Ref Range Status   MRSA, PCR NEGATIVE NEGATIVE Final   Staphylococcus aureus NEGATIVE NEGATIVE Final    Comment: (NOTE) The Xpert SA Assay (FDA approved for NASAL specimens in patients 53 years of age and older), is one component of a comprehensive surveillance program. It is not intended to diagnose infection nor to guide or monitor treatment. Performed at [facility], [address] [place], [place] [address]     Labs: CBC: Recent Labs  Lab 05/10/24 1131 05/11/24 0531  WBC 10.7* 8.8  NEUTROABS 5.3  --   HGB 13.9 12.7*  HCT 41.7 38.1*  MCV 86.3 87.6  PLT 163 157   Basic Metabolic Panel: Recent Labs  Lab 05/10/24 1131 05/10/24 1806 05/11/24 0531  NA 129* 132* 135  K 4.0 4.3 4.4  CL 100 101 104  CO2 20* 20* 22  GLUCOSE 129* 92  87  BUN 64* 63* 58*  CREATININE 2.86* 2.40* 2.11*  CALCIUM  7.9* 7.8* 7.9*  MG  --   --  2.6*   Liver Function Tests: Recent Labs  Lab 05/10/24 1131 05/10/24 1806  AST 54* 52*  ALT 80* 76*  ALKPHOS 56 50  BILITOT 0.9 0.8  PROT 7.0 5.9*  ALBUMIN 2.9* 2.6*   CBG: No results for input(s): GLUCAP in the last 168 hours.  Discharge time spent: 28 minutes.  Length of inpatient stay: 0 days  Signed: [name], DO [facility] 05/11/2024

## 2024-05-17 DIAGNOSIS — R0683 Snoring: Secondary | ICD-10-CM | POA: Diagnosis not present

## 2024-05-17 DIAGNOSIS — I10 Essential (primary) hypertension: Secondary | ICD-10-CM | POA: Diagnosis not present

## 2024-05-17 DIAGNOSIS — R748 Abnormal levels of other serum enzymes: Secondary | ICD-10-CM | POA: Diagnosis not present

## 2024-05-17 DIAGNOSIS — N179 Acute kidney failure, unspecified: Secondary | ICD-10-CM | POA: Diagnosis not present

## 2024-05-28 ENCOUNTER — Other Ambulatory Visit: Payer: Self-pay | Admitting: Thoracic Surgery (Cardiothoracic Vascular Surgery)

## 2024-05-28 DIAGNOSIS — I712 Thoracic aortic aneurysm, without rupture, unspecified: Secondary | ICD-10-CM

## 2024-06-24 ENCOUNTER — Ambulatory Visit (HOSPITAL_COMMUNITY)
Admission: RE | Admit: 2024-06-24 | Discharge: 2024-06-24 | Disposition: A | Discharge status: Home / Self Care | Source: Ambulatory Visit | Attending: Internal Medicine | Admitting: Internal Medicine

## 2024-06-24 DIAGNOSIS — I7121 Aneurysm of the ascending aorta, without rupture: Secondary | ICD-10-CM | POA: Insufficient documentation

## 2024-06-24 DIAGNOSIS — I2510 Atherosclerotic heart disease of native coronary artery without angina pectoris: Secondary | ICD-10-CM | POA: Insufficient documentation

## 2024-06-24 DIAGNOSIS — I712 Thoracic aortic aneurysm, without rupture, unspecified: Secondary | ICD-10-CM

## 2024-06-24 DIAGNOSIS — I700 Atherosclerosis of aorta: Secondary | ICD-10-CM | POA: Insufficient documentation

## 2024-06-24 DIAGNOSIS — K8689 Other specified diseases of pancreas: Secondary | ICD-10-CM | POA: Diagnosis not present

## 2024-07-05 ENCOUNTER — Ambulatory Visit

## 2024-07-05 DIAGNOSIS — I712 Thoracic aortic aneurysm, without rupture, unspecified: Secondary | ICD-10-CM | POA: Diagnosis not present

## 2024-07-05 NOTE — Patient Instructions (Signed)
 Risk Modification in those with ascending thoracic aortic aneurysm:   Continue control of blood pressure (prefer BP 130/80 or less)   2. Avoid fluoroquinolone antibiotics (I.e Ciprofloxacin, Avelox, Levofloxacin, Ofloxacin)   3.  Use of statin (to decrease cardiovascular risk)   4.  Exercise and activity limitations is individualized, but in general, contact sports are to be avoided and one should avoid heavy lifting (defined as half of ideal body weight) and exercises involving sustained Valsalva maneuver.   5.  Follow-up in one year with CTA chest.  OK to use a non-contrast CT if you have had a recent study for surveillance of the lung nodule.

## 2024-07-05 NOTE — Progress Notes (Addendum)
 [address] [place] [address]             [phone]       CARDIOTHORACIC SURGERY TELEPHONE VIRTUAL OFFICE NOTE  Referring Provider is Delice Lamar CROME, MD Primary Cardiologist is Candyce Reek, MD PCP is Dwight Trula SQUIBB, MD   HPI:  I spoke with Glenn Young (DOB [birth year] ) via telephone on 07/05/2024 at 10:04 AM and verified that I was speaking with the correct person using more than one form of identification.  We discussed the fact that I was contacting them from my office in [place] and they were located at home in [place], [place], as well as the reason(s) for conducting our visit virtually instead of in-person.  The patient expressed understanding the circumstances and agreed to proceed as described.   Glenn Young is a 78 year old man with medical history of hypertension, hyperlipidemia and hypothyroidism.  Called today to discuss the continued surveillance of ascending thoracic aortic aneurysm.  On CTA of chest on 05/10/2024 aneurysm measured 4.7 cm.  On CT of chest without contrast on 06/24/2024 aneurysm measured 5.0 cm.  Echocardiogram from 2023 showed normal aortic valve structure.  He reports that he checks his blood pressure at home.  He was getting higher readings so his PCP restarted his blood pressure medications.  He does exercise with golfing.  He denies heavy lifting.    Current Outpatient Medications  Medication Sig Dispense Refill   atorvastatin  (LIPITOR) 20 MG tablet Take 20 mg by mouth every Monday, Wednesday, and Friday at 8 PM.     celecoxib (CELEBREX) 100 MG capsule Take 100 mg by mouth daily as needed for mild pain (pain score 1-3).     Cholecalciferol  (VITAMIN D3) 50 MCG (2000 UT) TABS Take 2,000 Units by mouth daily.     levothyroxine  (SYNTHROID ) 100 MCG tablet Take 100 mcg by mouth daily before breakfast.     omeprazole  (PRILOSEC  OTC) 20 MG tablet Take 20 mg by mouth daily before breakfast.     ondansetron   (ZOFRAN ) 4 MG tablet Take 1 tablet (4 mg total) by mouth every 6 (six) hours as needed for nausea. 20 tablet 0   Probiotic Product (PROBIOTIC DAILY PO) Take 1 capsule by mouth every Monday, Wednesday, and Friday at 8 PM.     No current facility-administered medications for this visit.     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   EXAM: CT CHEST WITHOUT CONTRAST   TECHNIQUE: Multidetector CT imaging of the chest was performed following the standard protocol without IV contrast.   RADIATION DOSE REDUCTION: This exam was performed according to the departmental dose-optimization program which includes automated exposure control, adjustment of the mA and/or kV according to patient size and/or use of iterative reconstruction technique.   COMPARISON:  CT Chest, Abdomen, and Pelvis without contrast 05/10/2024. CTA chest 05/12/2022 and earlier.   FINDINGS: Cardiovascular: Noncontrast exam today. Measured with the same technique on 06/12/2023 (series 301, image 61 now), fusiform aneurysmal enlargement of the ascending thoracic aorta now measures 50 mm. As before, this appears to taper in the arch. Mild for age superimposed Calcified aortic atherosclerosis. Calcified coronary artery atherosclerosis evident on series 301, image 65. Heart size remains normal. No pericardial effusion. Vascular patency is not evaluated in the absence of IV contrast.   Mediastinum/Nodes: Negative.  No mediastinal mass, lymphadenopathy.   Lungs/Pleura: Major airways are patent. Stable lung volumes to last year. No acute or suspicious lung finding; 3-4 mm left lower lobe lung nodules (series 302, images 80 and 86) are stable since 07/27/2021 (no follow-up imaging recommended).   Upper Abdomen: Chronic gastrohepatic ligament lymph node measuring 11 mm on series 301, image 118 corresponding to the finding described on  06/12/2023 and not significantly changed from CT Abdomen and Pelvis 01/28/2004 (benign). Negative visible noncontrast liver, gallbladder, spleen, adrenal glands, left renal upper pole, and bowel in the upper abdomen. Partial pancreatic atrophy and stable dystrophic calcification at the pancreatic tail.   Musculoskeletal: Bulky chronic lower thoracic anterior endplate osteophytosis. No acute or suspicious osseous lesion.   IMPRESSION: 1. Ascending thoracic aortic aneurysm, now up to 50 mm diameter and increased compared to 06/12/2023 (47 mm at that time). Recommend semi-annual imaging followup by CTA or MRA and referral to cardiothoracic surgery if not already obtained. This recommendation follows 2010 ACCF/AHA/AATS/ACR/ASA/SCA/SCAI/SIR/STS/SVM Guidelines for the Diagnosis and Management of Patients With Thoracic Aortic Disease. Circulation. 2010; 121: [id number]. Aortic aneurysm NOS (ICD10-I71.9)   2. Otherwise stable noncontrast CT appearance of the visible chest and upper abdomen; calcified coronary artery atherosclerosis. Mild for age Aortic Atherosclerosis (ICD10-I70.0).     Electronically Signed   By: VEAR Hurst M.D.   On: 06/24/2024 09:02    Plan:  Thoracic aortic aneurysm without rupture, unspecified part (HCC) -4.7 cm ascending thoracic aortic aneurysm on CTA of chest. Echocardiogram showed normal aortic valve structure. We discussed the natural history and and risk factors for growth of ascending aortic aneurysms. Discussed recommendations to minimize the risk of further expansion or dissection including careful blood pressure control, avoidance of contact sports and heavy lifting, attention to lipid management.  We covered the importance of continued smoking cessation.  The patient does not yet meet surgical criteria of >5.5cm. The patient is aware of signs and symptoms of aortic dissection and when to present to the emergency department    -Follow up in one year with CTA of  chest and virtual visit   I discussed limitations of evaluation and management via telephone.  The patient was advised to call back for repeat telephone consultation or to seek an in-person evaluation if questions arise or the patient's clinical condition changes in any significant manner.  I spent in excess of 20 minutes of non-face-to-face time during the conduct of this telephone virtual office consultation, including pre-visit review of the patient's records and direct conversation with the patient.   Level 1  (99441)             5-10 minutes Level 2  (99442)            11-20 minutes Level 3  (99443)            21-30 minutes   Manuelita CHRISTELLA Rough, PA-C 07/05/2024 10:04 AM

## 2024-07-17 DIAGNOSIS — R0683 Snoring: Secondary | ICD-10-CM | POA: Diagnosis not present

## 2024-07-17 DIAGNOSIS — Z125 Encounter for screening for malignant neoplasm of prostate: Secondary | ICD-10-CM | POA: Diagnosis not present

## 2024-07-17 DIAGNOSIS — I10 Essential (primary) hypertension: Secondary | ICD-10-CM | POA: Diagnosis not present

## 2024-07-17 DIAGNOSIS — Z79899 Other long term (current) drug therapy: Secondary | ICD-10-CM | POA: Diagnosis not present

## 2024-07-17 DIAGNOSIS — E039 Hypothyroidism, unspecified: Secondary | ICD-10-CM | POA: Diagnosis not present

## 2024-07-17 DIAGNOSIS — R748 Abnormal levels of other serum enzymes: Secondary | ICD-10-CM | POA: Diagnosis not present

## 2024-07-17 DIAGNOSIS — I719 Aortic aneurysm of unspecified site, without rupture: Secondary | ICD-10-CM | POA: Diagnosis not present

## 2024-07-17 DIAGNOSIS — Z0000 Encounter for general adult medical examination without abnormal findings: Secondary | ICD-10-CM | POA: Diagnosis not present

## 2024-07-17 DIAGNOSIS — E782 Mixed hyperlipidemia: Secondary | ICD-10-CM | POA: Diagnosis not present

## 2024-07-17 DIAGNOSIS — M109 Gout, unspecified: Secondary | ICD-10-CM | POA: Diagnosis not present

## 2024-07-17 DIAGNOSIS — K3189 Other diseases of stomach and duodenum: Secondary | ICD-10-CM | POA: Diagnosis not present

## 2024-07-25 DIAGNOSIS — Z23 Encounter for immunization: Secondary | ICD-10-CM | POA: Diagnosis not present

## 2024-08-19 ENCOUNTER — Encounter: Payer: Self-pay | Admitting: Radiology

## 2024-08-30 DIAGNOSIS — Z23 Encounter for immunization: Secondary | ICD-10-CM | POA: Diagnosis not present

## 2024-09-16 DIAGNOSIS — R0683 Snoring: Secondary | ICD-10-CM | POA: Diagnosis not present
